# Patient Record
Sex: Female | Born: 1970 | Race: White | Hispanic: No | Marital: Married | Smoking: Never smoker
Health system: Southern US, Community
[De-identification: ages and names within clinical notes are randomized; demographics above are authoritative.]

## PROBLEM LIST (undated history)

## (undated) DIAGNOSIS — E119 Type 2 diabetes mellitus without complications: Secondary | ICD-10-CM

## (undated) DIAGNOSIS — I10 Essential (primary) hypertension: Secondary | ICD-10-CM

## (undated) DIAGNOSIS — E785 Hyperlipidemia, unspecified: Secondary | ICD-10-CM

## (undated) DIAGNOSIS — E210 Primary hyperparathyroidism: Secondary | ICD-10-CM

---

## 1991-12-19 HISTORY — PX: WISDOM TOOTH EXTRACTION: SHX21

## 1999-07-14 ENCOUNTER — Other Ambulatory Visit: Admission: RE | Admit: 1999-07-14 | Discharge: 1999-07-14 | Payer: Self-pay | Admitting: Obstetrics and Gynecology

## 1999-08-26 ENCOUNTER — Encounter: Admission: RE | Admit: 1999-08-26 | Discharge: 1999-11-24 | Payer: Self-pay | Admitting: Internal Medicine

## 1999-09-28 ENCOUNTER — Encounter: Admission: RE | Admit: 1999-09-28 | Discharge: 1999-09-28 | Payer: Self-pay | Admitting: Internal Medicine

## 2000-08-01 ENCOUNTER — Other Ambulatory Visit: Admission: RE | Admit: 2000-08-01 | Discharge: 2000-08-01 | Payer: Self-pay | Admitting: Obstetrics and Gynecology

## 2001-10-09 ENCOUNTER — Other Ambulatory Visit: Admission: RE | Admit: 2001-10-09 | Discharge: 2001-10-09 | Payer: Self-pay | Admitting: Obstetrics & Gynecology

## 2003-01-28 ENCOUNTER — Other Ambulatory Visit: Admission: RE | Admit: 2003-01-28 | Discharge: 2003-01-28 | Payer: Self-pay | Admitting: Gynecology

## 2003-09-16 ENCOUNTER — Ambulatory Visit (HOSPITAL_COMMUNITY): Admission: RE | Admit: 2003-09-16 | Discharge: 2003-09-16 | Payer: Self-pay | Admitting: Obstetrics and Gynecology

## 2003-09-16 ENCOUNTER — Encounter: Payer: Self-pay | Admitting: Obstetrics and Gynecology

## 2003-10-08 ENCOUNTER — Ambulatory Visit (HOSPITAL_COMMUNITY): Admission: RE | Admit: 2003-10-08 | Discharge: 2003-10-08 | Payer: Self-pay | Admitting: Obstetrics and Gynecology

## 2003-10-08 ENCOUNTER — Encounter: Payer: Self-pay | Admitting: Obstetrics and Gynecology

## 2003-10-29 ENCOUNTER — Ambulatory Visit (HOSPITAL_COMMUNITY): Admission: RE | Admit: 2003-10-29 | Discharge: 2003-10-29 | Payer: Self-pay | Admitting: Obstetrics and Gynecology

## 2003-11-06 ENCOUNTER — Encounter: Admission: RE | Admit: 2003-11-06 | Discharge: 2003-11-06 | Payer: Self-pay | Admitting: Obstetrics and Gynecology

## 2003-11-10 ENCOUNTER — Encounter: Admission: RE | Admit: 2003-11-10 | Discharge: 2003-11-10 | Payer: Self-pay | Admitting: Obstetrics and Gynecology

## 2003-11-17 ENCOUNTER — Encounter: Admission: RE | Admit: 2003-11-17 | Discharge: 2003-11-17 | Payer: Self-pay | Admitting: Obstetrics and Gynecology

## 2003-11-19 ENCOUNTER — Ambulatory Visit (HOSPITAL_COMMUNITY): Admission: RE | Admit: 2003-11-19 | Discharge: 2003-11-19 | Payer: Self-pay | Admitting: Obstetrics and Gynecology

## 2003-11-20 ENCOUNTER — Encounter: Admission: RE | Admit: 2003-11-20 | Discharge: 2003-11-20 | Payer: Self-pay | Admitting: Obstetrics and Gynecology

## 2003-11-24 ENCOUNTER — Encounter: Admission: RE | Admit: 2003-11-24 | Discharge: 2003-11-24 | Payer: Self-pay | Admitting: Obstetrics and Gynecology

## 2003-11-27 ENCOUNTER — Encounter: Admission: RE | Admit: 2003-11-27 | Discharge: 2003-11-27 | Payer: Self-pay | Admitting: Obstetrics and Gynecology

## 2003-12-01 ENCOUNTER — Encounter: Admission: RE | Admit: 2003-12-01 | Discharge: 2003-12-01 | Payer: Self-pay | Admitting: Obstetrics and Gynecology

## 2003-12-04 ENCOUNTER — Encounter: Admission: RE | Admit: 2003-12-04 | Discharge: 2003-12-04 | Payer: Self-pay | Admitting: Obstetrics and Gynecology

## 2003-12-07 ENCOUNTER — Encounter: Admission: RE | Admit: 2003-12-07 | Discharge: 2003-12-07 | Payer: Self-pay | Admitting: Obstetrics and Gynecology

## 2003-12-14 ENCOUNTER — Encounter: Admission: RE | Admit: 2003-12-14 | Discharge: 2003-12-14 | Payer: Self-pay | Admitting: Obstetrics and Gynecology

## 2003-12-28 ENCOUNTER — Inpatient Hospital Stay (HOSPITAL_COMMUNITY): Admission: RE | Admit: 2003-12-28 | Discharge: 2003-12-30 | Payer: Self-pay | Admitting: Obstetrics and Gynecology

## 2003-12-28 ENCOUNTER — Encounter (INDEPENDENT_AMBULATORY_CARE_PROVIDER_SITE_OTHER): Payer: Self-pay | Admitting: Specialist

## 2005-03-22 ENCOUNTER — Other Ambulatory Visit: Admission: RE | Admit: 2005-03-22 | Discharge: 2005-03-22 | Payer: Self-pay | Admitting: Obstetrics and Gynecology

## 2006-07-18 ENCOUNTER — Other Ambulatory Visit: Admission: RE | Admit: 2006-07-18 | Discharge: 2006-07-18 | Payer: Self-pay | Admitting: Obstetrics and Gynecology

## 2007-09-24 ENCOUNTER — Ambulatory Visit (HOSPITAL_COMMUNITY): Admission: RE | Admit: 2007-09-24 | Discharge: 2007-09-24 | Payer: Self-pay | Admitting: Gynecology

## 2007-12-19 HISTORY — PX: DILATION AND CURETTAGE OF UTERUS: SHX78

## 2008-01-01 ENCOUNTER — Ambulatory Visit (HOSPITAL_COMMUNITY): Admission: RE | Admit: 2008-01-01 | Discharge: 2008-01-01 | Payer: Self-pay | Admitting: Obstetrics and Gynecology

## 2008-01-01 ENCOUNTER — Encounter (INDEPENDENT_AMBULATORY_CARE_PROVIDER_SITE_OTHER): Payer: Self-pay | Admitting: Obstetrics and Gynecology

## 2008-03-05 ENCOUNTER — Other Ambulatory Visit: Admission: RE | Admit: 2008-03-05 | Discharge: 2008-03-05 | Payer: Self-pay | Admitting: Obstetrics and Gynecology

## 2008-06-15 ENCOUNTER — Ambulatory Visit (HOSPITAL_COMMUNITY): Admission: RE | Admit: 2008-06-15 | Discharge: 2008-06-15 | Payer: Self-pay | Admitting: Obstetrics and Gynecology

## 2008-12-28 ENCOUNTER — Inpatient Hospital Stay (HOSPITAL_COMMUNITY): Admission: AD | Admit: 2008-12-28 | Discharge: 2008-12-31 | Payer: Self-pay | Admitting: Obstetrics and Gynecology

## 2008-12-28 ENCOUNTER — Encounter (INDEPENDENT_AMBULATORY_CARE_PROVIDER_SITE_OTHER): Payer: Self-pay | Admitting: Obstetrics and Gynecology

## 2009-10-13 ENCOUNTER — Other Ambulatory Visit: Admission: RE | Admit: 2009-10-13 | Discharge: 2009-10-13 | Payer: Self-pay | Admitting: Obstetrics and Gynecology

## 2011-04-03 LAB — GLUCOSE, CAPILLARY
Glucose-Capillary: 100 mg/dL — ABNORMAL HIGH (ref 70–99)
Glucose-Capillary: 121 mg/dL — ABNORMAL HIGH (ref 70–99)
Glucose-Capillary: 156 mg/dL — ABNORMAL HIGH (ref 70–99)
Glucose-Capillary: 166 mg/dL — ABNORMAL HIGH (ref 70–99)
Glucose-Capillary: 241 mg/dL — ABNORMAL HIGH (ref 70–99)
Glucose-Capillary: 96 mg/dL (ref 70–99)

## 2011-04-03 LAB — CBC
HCT: 33.1 % — ABNORMAL LOW (ref 36.0–46.0)
MCHC: 32.9 g/dL (ref 30.0–36.0)
MCV: 75 fL — ABNORMAL LOW (ref 78.0–100.0)
RBC: 4.42 MIL/uL (ref 3.87–5.11)
RDW: 18.9 % — ABNORMAL HIGH (ref 11.5–15.5)
WBC: 7.2 10*3/uL (ref 4.0–10.5)

## 2011-04-03 LAB — COMPREHENSIVE METABOLIC PANEL
ALT: 14 U/L (ref 0–35)
BUN: 6 mg/dL (ref 6–23)
CO2: 22 mEq/L (ref 19–32)
Calcium: 7.8 mg/dL — ABNORMAL LOW (ref 8.4–10.5)
Chloride: 103 mEq/L (ref 96–112)
Creatinine, Ser: 0.54 mg/dL (ref 0.4–1.2)
GFR calc Af Amer: >60 mL/min (ref >60)
GFR calc non Af Amer: >60 mL/min (ref >60)
Glucose, Bld: 153 mg/dL — ABNORMAL HIGH (ref 70–99)
Sodium: 131 mEq/L — ABNORMAL LOW (ref 135–145)
Total Bilirubin: 0.2 mg/dL — ABNORMAL LOW (ref 0.3–1.2)
Total Protein: 4.9 g/dL — ABNORMAL LOW (ref 6.0–8.3)

## 2011-04-03 LAB — URIC ACID: Uric Acid, Serum: 7.2 mg/dL — ABNORMAL HIGH (ref 2.4–7.0)

## 2011-04-03 LAB — MAGNESIUM: Magnesium: 4.1 mg/dL — ABNORMAL HIGH (ref 1.5–2.5)

## 2011-04-03 LAB — RPR: RPR Ser Ql: NONREACTIVE

## 2011-05-02 NOTE — Op Note (Signed)
[name], [name]                ACCOUNT NO.:  [id number]   MEDICAL RECORD NO.:  [id number]          PATIENT TYPE:  INP   LOCATION:  9199                          [facility]  [facility]   PHYSICIAN:  Charles A. Delcambre, MDDATE OF BIRTH:  16-Oct-1971   DATE OF PROCEDURE:  12/28/2008  DATE OF DISCHARGE:                               OPERATIVE REPORT   PREOPERATIVE DIAGNOSES:  1. Intrauterine pregnancy at 38 weeks and 3 days.  2. Previous cesarean section.  3. Preeclampsia.  4. Diabetes mellitus.  5. Polyhydramnios.  6. Undesired fertility.   POSTOPERATIVE DIAGNOSES:  1. Intrauterine pregnancy at 38 weeks and 3 days.  2. Previous cesarean section.  3. Preeclampsia.  4. Diabetes mellitus.  5. Polyhydramnios.  6. Undesired fertility.  7. Macrosomia.   PROCEDURE:  Repeat low transverse cesarean section converted to a T  incision on the uterus rendering a classical C-section modified P[place]oy  bilateral tubal ligation.   SURGEON:  Charles A. Delcambre, MD   ASSISTANT:  None.   COMPLICATIONS:  Macrosomia with moderate difficulty delivering the  baby's head.  Vacuum assist was done with 2 pump offs with Mityvac  replacement over the occiput with the Alexis removed.  The uterus T'd to  a classical and the lateral edges of the uterine incision were cut with  bandage scissors.  I did use with the vacuum final attempt and head did  deliver without difficulty at that point.  Baby was tight fit and noted  to be macrosomic.   FINDINGS:  Vigorous female at 80, Apgars 8 and 9 (I heard 8 and 8 from  neonatologist).  Cord arterial blood gas 7.15, venous blood gas 7.24.   SPECIMENS:  Placenta, portion of right and left fallopian tubes to  pathology.  Otherwise, normal uterus and normal tubes and ovaries noted.   DESCRIPTION OF PROCEDURE:  The patient was taken to the operating room  and placed in supine position.  An appendiceal incision was made with a  knife, carried down to fascia.   Fascia was incised with a knife and Mayo  scissors.  Rectus sheath was released superiorly and inferiorly.  The  peritoneum was entered with Metzenbaum scissors without damage to  surrounding structures.  Traction was used to extend the incision.  Alexis retractor was to be placed at that time, but omental adhesions to  the pelvis were noted and these were crossclamped, cut, and then free  tied with 2-0 Vicryl.  This released the omentum to be packed out above.  Pack was placed.  Jon Gills was placed.  I did require the pack to get a  clean sweep.  For that reason, the Jon Gills was applied and lap was then  removed.  Omentum did not cut down into the field, so the lap was  replaced.  Vesicouterine peritoneum was incised with the Metzenbaum  scissors and blunt dissection was used to develop the bladder flap.  This was well down in the Closter retractor.  A transverse incision was  done to the amniotomy.  Large amount of clear fluid was noted.  A hand  was inserted.  Occiput was lifted to the uterine incision, but  operator's assistant's pressure and the physician could not yield  delivery.  Alexis retractor was removed.  For this reason as noted  above, vacuum extractor was used, 2 pop offs in the green zone, Mityvac,  and third application directly over the occiput did achieve delivery  after T'ing the uterus and cutting the lateral edges with the bandage  scissors.  Infant was cut free, handed to the neonatologist after being  shown to the parents.  Placenta was manually extracted.  Uterus was then  closed at the T classical portion with two layers of 0 Vicryl running  locking and some interrupted stitches on the uterus serosa of 2-0  Vicryl.  The remainder of the incision was closed in standard fashion  running nonlocking suture and after subfascial hemostasis was excellent,  fascia was then closed with 0 Vicryl running nonlocking suture.  The  subcutaneous tissue was irrigated.   Intraperitoneally, the pelvis had  been irrigated prior to closure of the fascia and uterine incision was  of good hemostasis.  Bladder flap was of good hemostasis.  The  irrigation was carried out.  Sterile skin clips were used to close the  skin.  Sterile dressing was applied.  The patient was transferred out to  recovery, then to go to [facility] for magnesium sulfate seizure prophylaxis.      Charles A. Sydnee Cabal, MD  Electronically Signed     CAD/MEDQ  D:  12/28/2008  T:  12/29/2008  Job:  [id number]

## 2011-05-02 NOTE — H&P (Signed)
[name], [name]                ACCOUNT NO.:  [id number]   MEDICAL RECORD NO.:  [id number]          PATIENT TYPE:  INP   LOCATION:  NA                            [facility]   PHYSICIAN:  Charles A. Delcambre, MDDATE OF BIRTH:  01/05/1971   DATE OF ADMISSION:  DATE OF DISCHARGE:                              HISTORY & PHYSICAL   A 40 year old, para 1-0-1-1, [facility] January 04, 2009, to be admitted on  January 04, 2009, to undergo repeat cesarean section and bilateral tubal  ligation for sterilization.  She gives informed consent, accepts the  risks of infection, bleeding, bowel and bladder damage, blood product  risk including hepatitis and HIV exposure, ureteral damage, failed tubal  ligation approximately 1 in 400, permanence and irreversibility of the  procedure by design.  All questions were answered.  She gives informed  consent and we will proceed.  At time of this dictation, 1+ protein and  blood pressure of 140/92.  She did have preeclampsia at last pregnancy  as well.  Labs are pending at this time.   PAST MEDICAL HISTORY:  1. PCO.  2. Glucose intolerance to overt diabetes at this time, pregestational.   PAST SURGICAL HISTORY:  D and E, primary low transverse cesarean  section.   MEDICATIONS:  Insulin in the morning 32 NPH 8 regular and in the evening  26 NPH 6 regular, prenatal vitamins, and iron.   ALLERGIES:  No known drug allergies.   SOCIAL HISTORY:  No tobacco, ethanol, or drug use.  Married, monogamous  relationship with her husband.  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   FAMILY HISTORY:  Unrelated.   PHYSICAL EXAMINATION:  VITAL SIGNS:  Blood pressure 144/92, weight 191  pounds, respirations 18, pulse 90.  HEART:  Regular rate and rhythm, 2/6 systolic ejection murmur at the  left sternal border.  LUNGS:  Clear bilaterally.  ABDOMEN:  Gravid.  Fundal height 38.  Ultrasound today continues with  borderline polyhydramnios 28 cm total.  She  has been 26-27 up to 28, 29  and back down to 26 in the latter stages of pregnancy as we checked at  the biophysical profile.  Fetal heart rate 154.  Biophysical profile  10/10.  EXTREMITIES:  [place] pitting edema.  Deep tendon reflexes 1+  symmetrical.  No beats of clonus bilaterally.   ASSESSMENT:  The patient to be 39 weeks and 2 days with pregestational  diabetes, history of preeclampsia, polyhydramnios, mild anemia, desiring  tubal ligation, Rh negative.   PLAN:  Repeat cesarean section, bilateral tubal ligation, diabetes  management sliding scale.  Last hemoglobin checked 9.2 on November 09, 2008.  Since that time, she had a hemoglobin that was 10.3 on December 21, 2008.  Sugars have been in the range of 90-100.  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  She will remain n.p.o. past midnight, and  we will see  her at that time for delivery, otherwise if she were to develop symptoms  or signs of preeclampsia, would move towards delivery time sooner.  Blood test A negative, antibody screen negative, VDRL nonreactive,  rubella immune, hepatitis B surface antigen negative, HIV negative, Pap  negative, GC and chlamydia negative, thyroid and TSH negative, quad  screen negative, first trimester screen normal.  Quad screen only AFP  was done and was negative.  Hemoglobin is noted above.  HIV at 36 weeks  and Group B strep were both negative.       Charles A. Sydnee Cabal, MD  Electronically Signed     CAD/MEDQ  D:  12/24/2008  T:  12/25/2008  Job:  [id number]

## 2011-05-02 NOTE — Op Note (Signed)
NAMEEUFELIA, VENO                ACCOUNT NO.:  [id number]   MEDICAL RECORD NO.:  [id number]          PATIENT TYPE:  AMB   LOCATION:  SDC                           [facility]   PHYSICIAN:  Charles A. Delcambre, MDDATE OF BIRTH:  [birth year]   DATE OF PROCEDURE:  01/01/2008  DATE OF DISCHARGE:                               OPERATIVE REPORT   PREOPERATIVE DIAGNOSIS:  Missed abortion, 7-8 weeks.   POSTOPERATIVE DIAGNOSIS:  Missed abortion, 7-8 weeks.   PROCEDURES:  1. Dilation evacuation.  2. Paracervical block.   SURGEON:  Charles A. Delcambre, MD.   ASSISTANT:  None.   COMPLICATIONS:  None.   ESTIMATED BLOOD LOSS:  Less than 25 mL.   FINDINGS:  Minor amount of products of conception consistent with 8-week  fetal demise.   SPECIMEN:  Products of conception to pathology.   Instrument, sponge, needle count correct x2.   PROCEDURE:  The patient was taken to the operating room, placed in the  supine position and sedation was accomplished.  She was then placed in  dorsal lithotomy position in universal stirrups.  Sterile prep and drape  was undertaken.  A weighted speculum placed in the vagina.  Tenaculum  used on the anterior lip of the cervix.  Paracervical block with 25%  plain Marcaine was placed, 20 mL divided equally between 8:00 and 4:00  o'clock.  There was no evidence of intravascular injection.  Hanks  dilators were used to dilate enough to pass an 8 mm curved suction  curette.  Suction curette was used at 50 cmHg suction with several  passes, followed by a generalized curettage and then several more passes  to get tissue that was mildly adherent.  There was no evidence of  perforation.  Final pass yielded no further tissue.  One final pass  beyond that was done and yielded no further tissue.  The  procedure was terminated.  Tenaculum was removed.  Pressure was held.  Hemostasis was obtained at the tenaculum site.  There was no excessive  bleeding from the  uterus.  Procedure was terminated and the patient was  awakened and taken to recovery with physician in attendance, having  tolerated her procedure well.      Charles A. Sydnee Cabal, MD  Electronically Signed     CAD/MEDQ  D:  01/01/2008  T:  01/01/2008  Job:  [id number]

## 2011-05-02 NOTE — H&P (Signed)
[name], [name]                ACCOUNT NO.:  [id number]   MEDICAL RECORD NO.:  [id number]          PATIENT TYPE:  AMB   LOCATION:  SDC                           [facility]   PHYSICIAN:  Charles A. Delcambre, MDDATE OF BIRTH:  12/09/1971   DATE OF ADMISSION:  DATE OF DISCHARGE:                              HISTORY & PHYSICAL   HISTORY OF PRESENT ILLNESS:  She is a 40 year old para 1-0-0-1, who is  now 7-[redacted] weeks pregnant with fetal demise/missed abortion and is to be  admitted to undergo dilation and evacuation.  She is informed, consents  and accepts risks of bleeding, blood product risks, hepatitis and HIV  exposure, uterine perforation, retained tissue and second D&C.  She was  offered Cytotec versus expectant management, and chooses D&E.   PAST MEDICAL HISTORY:  Diabetes.  Hemoglobin A1c was 5.9 about two  months ago.   PAST SURGICAL HISTORY:  Low-transverse cesarean section.   MEDICATIONS:  Glucophage during the day 1000 mg divided and at bedtime  she takes 30 units of long-acting 24-hour acting insulin.   ALLERGIES:  No known drug allergies.   SOCIAL HISTORY:  No tobacco, ethanol or drug use.  She is married.   FAMILY HISTORY:  Father with renal cell carcinoma and hypertension.   REVIEW OF SYSTEMS:  Denies bleeding, fever, chills, nausea, vomiting,  chest pain, shortness of breath or wheezing, urgency or frequency,  diarrhea or constipation.   PHYSICAL EXAMINATION:  GENERAL:  Alert and oriented x3.  VITAL SIGNS:  Blood pressure 130/70.  Weight 146.  Respirations 20.  Pulse 88.  HEENT:  Exam grossly within normal limits.  NECK:  Supple without thyromegaly or adenopathy.  LUNGS:  Clear bilaterally.  HEART:  Regular rate and rhythm.  ABDOMEN:  Soft, flat and nontender.  PELVIC:  Uterus not enlarged.  Adnexa nontender.  Ovaries are palpated  and normal in size.   ASSESSMENT:  Missed abortion, seven to eight weeks.   PLAN:  Dilation and evacuation.  She is Rh  negative and therefore will  need RhoGAM with the procedure.  All questions are answered.  She is  scheduled for 9:45 tomorrow morning.  She will go ahead and drop her  evening insulin to 10 and be n.p.o. after midnight and will not take her  Glucophage in the morning as well.      Charles A. Sydnee Cabal, MD  Electronically Signed     CAD/MEDQ  D:  12/31/2007  T:  12/31/2007  Job:  [id number]

## 2011-05-05 NOTE — H&P (Signed)
[name], [name]                            ACCOUNT NO.:  [id number]   MEDICAL RECORD NO.:  [id number]                   PATIENT TYPE:   LOCATION:                                       [facility]   PHYSICIAN:  Charles A. Sydnee Cabal, MD            DATE OF BIRTH:  05/07/1971   DATE OF ADMISSION:  12/28/2003  DATE OF DISCHARGE:                                HISTORY & PHYSICAL   REASON FOR ADMISSION:  The patient to be admitted December 28, 2003 to  undergo primary low transverse cesarean section secondary to unfavorable  cervix, macrosomia, pregnancy-induced hypertension, and insulin-dependent  diabetes mellitus.   A 40 year old para 0-0-0-0 with [facility] January 02, 2004.  The patient will be  pregnant [redacted] weeks 2 days on day of admission.  She gives informed consent  after counseling regarding options of induction versus cesarean section.  She opts for cesarean section.  She accepts risks of infection, bleeding,  bowel and bladder damage, ureteral damage, blood product risks including  hepatitis and HIV exposure.  All questions were answered.  She last had  blood pressure 145/92 on December 24, 2003, declining intervention at this  time.  Stable pattern of sugars on insulin dosage not specified with doses  managed with Dr. Talmage Nap.  PIH labs were normal today with creatinine 0.6,  uric acid 6.5, AST 25, platelets stable at 195.  A 24-hour urine protein was  585 mg dated December 20, 2003.   PAST MEDICAL HISTORY:  1. PCO.  2. Glucose intolerance.   PAST SURGICAL HISTORY:  None.   MEDICATIONS:  Insulin 70/30 split, doses not specified.   ALLERGIES:  No known drug allergies.   SOCIAL HISTORY:  No tobacco, ethanol, or drug use.   FAMILY HISTORY:  Unrelated.   REVIEW OF SYSTEMS:  She denies headache, scotomata, right upper quadrant  pain, or blurred vision.   PHYSICAL EXAMINATION:  GENERAL:  Alert and oriented x3, no distress.  VITAL SIGNS:  Blood pressure 145/92, respirations 16,  pulse 80, weight 182  pounds.  HEENT:  Normal.  NECK:  Supple without thyromegaly or adenopathy.  LUNGS:  Clear bilaterally.  HEART:  Regular rate and rhythm without murmur, rub, or gallop.  BREAST:  Deferred.  ABDOMEN:  Gravid, fundal height 39 cm.  PELVIC:  Cervix is closed and posterior.  EXTREMITIES:  Mild to moderate edema.  Deep tendon reflexes 2+.   LABORATORY DATA:  As noted above.   ASSESSMENT:  1. Intrauterine pregnancy at 39 weeks 2 days on day of admission with     insulin-dependent diabetes mellitus, pregnancy-induced hypertension, mild     but with significant proteinuria.  2. Estimated fetal weight on ultrasound today, day of dictation - December 24, 2003 - was 4004 g, likely to be over 4000 g next week; 8 pounds 13     ounces, likely to be beyond  9 pounds next week.   For these combined reasons the patient opts for cesarean section and I  concur.  We will plan n.p.o. past midnight the morning of admission, no  insulin, repeat PIH labs the morning of admission, bedrest with PIH  precautions are ordered between now and the time of admission.  All  questions are answered.  She gives informed consent and we will proceed as  outlined.                                               Charles A. Sydnee Cabal, MD    CAD/MEDQ  D:  12/24/2003  T:  12/24/2003  Job:  [id number]

## 2011-05-05 NOTE — Discharge Summary (Signed)
NAME:  [name], [name]                          ACCOUNT NO.:  [id number]   MEDICAL RECORD NO.:  [id number]                   PATIENT TYPE:  INP   LOCATION:  9104                                 [facility]   PHYSICIAN:  Charles A. Sydnee Cabal, MD            DATE OF BIRTH:  September 10, 1971   DATE OF ADMISSION:  12/28/2003  DATE OF DISCHARGE:  12/30/2003                                 DISCHARGE SUMMARY   DISCHARGE DIAGNOSES:  1. Intrauterine pregnancy at 39 weeks, 2 days.  2. Gestational diabetes, Insulin requiring.  3. Preeclampsia.  4. Macrosomia.   PROCEDURE:  Primary low transverse cesarean section.   DISPOSITION:  The patient was discharged home to follow up in the office in  48 hours for staples to be discontinued.  She was given convalescent  instructions.  Notify if temperature is greater than 101, increased bleeding  or pain or purulent discharge from the incision.  She is cautioned from  driving as well as heavy lifting greater than 25 pounds.   DISCHARGE MEDICATION:  Percocet 1 to 2 p.o. q.4h p.r.n.   History and physical as dictated and on the chart.   LABORATORY DATA:  Postoperative hemoglobin 10.1, hematocrit 28.6.  Chemistry  profile significant for elevated alkaline phosphatase at 161.   HOSPITAL COURSE:  The patient was admitted and underwent surgery as noted  above.  Postoperatively the patient had sugars of 129 and 110.  She desired  to not have any further sugars checked.  She was placed on magnesium  prophylaxis postpartum for 24 hours.  She was monitored in the AICU  secondary to the magnesium prophylaxis.  On postoperative day number 1 she  was moved from the unit and magnesium was discontinued at 12 o'clock.  Glucoses were continued with consent and noted to be 104 and 122, there was  one less than 2 hours after a snack that was 150, and fasting was again 112.  The patient strongly desired to discharged home.  She was voiding without  difficulty after  catheter was discontinued on postoperative day number 1.  She was tolerating a general diet with spontaneous return of bowel function  and pain was well controlled on p.o. medications.  She was discharged home  with follow up as noted above.                                               Charles A. Sydnee Cabal, MD    CAD/MEDQ  D:  01/27/2004  T:  01/27/2004  Job:  [id number]

## 2011-05-05 NOTE — Op Note (Signed)
NAME:  [name], [name]                          ACCOUNT NO.:  [id number]   MEDICAL RECORD NO.:  [id number]                   PATIENT TYPE:  INP   LOCATION:  9114                                 [facility]   PHYSICIAN:  Charles A. Sydnee Cabal, MD            DATE OF BIRTH:  1971-03-16   DATE OF PROCEDURE:  12/28/2003  DATE OF DISCHARGE:                                 OPERATIVE REPORT   PREOPERATIVE DIAGNOSES:  1. Intrauterine pregnancy at 39 weeks and two days.  2. Gestational diabetes, insulin dependent.  3. Preeclampsia.  4. Macrosomia.   POSTOPERATIVE DIAGNOSES:  1. Intrauterine pregnancy at 39 weeks and two days.  2. Gestational diabetes, insulin dependent.  3. Preeclampsia.  4. Macrosomia.   PROCEDURE:  Primary low transverse cesarean section, elective.   SURGEON:  Charles A. Sydnee Cabal, MD   ASSISTANT:  Rudy Jew. Ashley Royalty, M.D.   COMPLICATIONS:  Nuchal cord x1.   ESTIMATED BLOOD LOSS:  500 mL.   ANESTHESIA:  Spinal anesthesia.   SPECIMENS:  Placenta to pathology.   FINDINGS:  Clear amniotic fluid, vigorous female, Apgars 8 and 9.  Sponge,  needle and instrument counts were correct x2. Neonatologist in attendance  with delivery.  Baby to the nursery.  Mother stable to recovery.   DESCRIPTION OF PROCEDURE:  The patient was taken to the operating room and  placed in the supine position, spinal anesthetic induced without difficulty.  Sterile prep and drape was done.  A Pfannenstiel incision was made with the  knife and carried down to the fascia.  The fascia was incised with the knife  and Mayo scissors.  Rectus sheath was sharply dissected superiorly and  inferiorly.  Rectus muscles were sharply dissected in the midline.  Peritoneum was entered with Metzenbaum scissors.  Traction was used to  extend the incision.  Bladder blade was placed.  Vesicouterine peritoneum  was incised with Metzenbaum scissors.  Blunt dissection was used to develop  the bladder flap.   Bladder blade was replaced.  Lower uterine segment  transverse incision was then made with the knife.  There was no damage to  the infant.  Traction was used to extend the incision.  Hand was inserted.  Fundal pressure was applied with operator's assistant and the infant was  delivered without difficulty.  Cord was clamped and infant was cut free,  shown to the parents and taken to the neonatologist.  Placenta was manually  expressed.  Internal surface of the uterus was wiped with moistened lap.  Uterus was then closed in two layers, first layer #1 chromic running and  locking.  Second layer imbricating, nonlocking #1 chromic.  Hemostasis was  excellent with single figure-of-eight suture placed near the left angle.  Pericolic gutters were cleansed of clotted blood and material.  Ovaries were  visualized bilaterally.  Hemostasis was again verified at the uterine  incision.  Subfascial hemostasis was good.  Fascia  was closed with #1 Vicryl  running nonlocking suture.  Subcutaneous hemostasis was noted to be good and  irrigation was carried out.  Sterile skin clips were used to close the skin.  Sterile dressing was applied.  The patient was taken to the recovery room  with physician in attendance having tolerated the procedure well.  Magnesium  sulfate will be started, 4 g bolus, 2 g per hour, in recovery and will  continue this for 24 hours secondary to preeclampsia.                                               Charles A. Sydnee Cabal, MD    CAD/MEDQ  D:  12/28/2003  T:  12/28/2003  Job:  [id number]

## 2011-05-05 NOTE — Discharge Summary (Signed)
[name], [name]                ACCOUNT NO.:  [id number]   MEDICAL RECORD NO.:  [id number]          PATIENT TYPE:  INP   LOCATION:  9108                          FACILITY:  [facility]   PHYSICIAN:  Charles A. Delcambre, MDDATE OF BIRTH:  12/28/1970   DATE OF ADMISSION:  12/28/2008  DATE OF DISCHARGE:  12/31/2008                               DISCHARGE SUMMARY   PRIMARY DISCHARGE DIAGNOSES:  1. Intrauterine pregnancy 38 weeks 3 days.  2. Preeclampsia.  3. Diabetes mellitus.  4. Polyhydramnios.  5. Undesired fertility.   PROCEDURE:  Repeat low transverse cesarean section, teed to a classical  cesarean section to get the baby out.  Modified [place] bilateral tubal  ligation.   FINDINGS:  Vigorous female, Apgars 8 and 9.  Cord arterial blood gas  7.15 and venous blood gas 7.24.   SPECIMENS:  Placenta in portion of right and left fallopian tubes to  pathology.   DISPOSITION:  The patient is discharged home to follow up in the office  in 24 hours time.  Staples discontinued.  I checked blood pressure and  CBG.  She is given precaution for headache, scotoma, and right upper  quadrant pain as well as no lifting for rhythm 25 pounds for 1 month to  notify incisional redness or drainage.  Temperature over 100 degrees.  No driving for 2 weeks.  Shower okay for 2 weeks and bath thereafter.   MEDICATION ON DISCHARGE:  1. Percocet 15/325 1-2 p.o. q.4 h p.r.n.  2. Motrin 800 mg 1 p.o. q.8 h p.r.n.  3. Over-the-counter iron 1 tablet a day.  4. 10 units of NovoLog 70/30 evening of delivery and then checked CBC      tomorrow.  The patient well-versed in managing diet on insulin on a      subcu sliding scale.   HISTORY AND PHYSICAL:  As dictated on the chart.   HOSPITAL COURSE:  The patient was admitted and underwent surgery as  noted above.  Vacuum-assist was done, difficult to get the baby out,  therefore uterus was teed.  She went to AICU to undergo MAC therapy.  She denied PIH symptoms at  that time.  Pain was controlled.  Diet was  tolerated.  Blood pressures were as high as 169/100.  She diuresed well  in ICU, had a few crackles in her lung bases that resolved.  She was  watched overnight with sliding-scale insulin and possible OC.  Postop  day #2, she was moved out of the unit and blood pressures were 130-  150/70.  Blood sugars 150, 207.  She was given 2 units of insulin h.s. with 207.  Postop 3, she was doing well.  Sugars were much better except evening  sugars.  She was precautioned and instructed and released home on postop  day #3.  Significant laboratory, postoperative hemoglobin 9.2,  hematocrit 27.9.      Charles A. Sydnee Cabal, MD  Electronically Signed     CAD/MEDQ  D:  02/06/2009  T:  02/07/2009  Job:  [id number]

## 2011-09-06 LAB — RH IMMUNE GLOBULIN WORKUP (NOT WOMEN'S HOSP)
ABO/RH(D): A NEG
Antibody Screen: NEGATIVE

## 2011-09-06 LAB — CBC
Hemoglobin: 11.9 — ABNORMAL LOW
Platelets: 374
RDW: 15.4

## 2011-10-12 ENCOUNTER — Other Ambulatory Visit (HOSPITAL_COMMUNITY)
Admission: RE | Admit: 2011-10-12 | Discharge: 2011-10-12 | Disposition: A | Discharge status: Home / Self Care | Payer: PRIVATE HEALTH INSURANCE | Source: Ambulatory Visit | Attending: Obstetrics and Gynecology | Admitting: Obstetrics and Gynecology

## 2011-10-12 ENCOUNTER — Other Ambulatory Visit: Payer: Self-pay | Admitting: Nurse Practitioner

## 2011-10-12 DIAGNOSIS — Z01419 Encounter for gynecological examination (general) (routine) without abnormal findings: Secondary | ICD-10-CM | POA: Insufficient documentation

## 2011-10-12 DIAGNOSIS — Z1159 Encounter for screening for other viral diseases: Secondary | ICD-10-CM | POA: Insufficient documentation

## 2014-01-20 ENCOUNTER — Other Ambulatory Visit: Payer: Self-pay | Admitting: Family Medicine

## 2014-01-20 DIAGNOSIS — R748 Abnormal levels of other serum enzymes: Secondary | ICD-10-CM

## 2014-02-11 ENCOUNTER — Other Ambulatory Visit (HOSPITAL_COMMUNITY): Payer: PRIVATE HEALTH INSURANCE

## 2014-02-19 ENCOUNTER — Other Ambulatory Visit (HOSPITAL_COMMUNITY): Payer: PRIVATE HEALTH INSURANCE

## 2014-02-20 ENCOUNTER — Encounter (HOSPITAL_COMMUNITY): Payer: Self-pay | Admitting: Internal Medicine

## 2014-09-03 ENCOUNTER — Other Ambulatory Visit (HOSPITAL_COMMUNITY)
Admission: RE | Admit: 2014-09-03 | Discharge: 2014-09-03 | Disposition: A | Discharge status: Home / Self Care | Payer: BC Managed Care – PPO | Source: Ambulatory Visit | Attending: Obstetrics and Gynecology | Admitting: Obstetrics and Gynecology

## 2014-09-03 ENCOUNTER — Other Ambulatory Visit: Payer: Self-pay | Admitting: Nurse Practitioner

## 2014-09-03 DIAGNOSIS — Z01419 Encounter for gynecological examination (general) (routine) without abnormal findings: Secondary | ICD-10-CM | POA: Diagnosis present

## 2014-09-07 LAB — CYTOLOGY - PAP

## 2015-03-24 ENCOUNTER — Other Ambulatory Visit: Payer: Self-pay

## 2015-03-24 DIAGNOSIS — Z1231 Encounter for screening mammogram for malignant neoplasm of breast: Secondary | ICD-10-CM

## 2015-04-08 ENCOUNTER — Ambulatory Visit
Admission: RE | Admit: 2015-04-08 | Discharge: 2015-04-08 | Disposition: A | Discharge status: Home / Self Care | Payer: BLUE CROSS/BLUE SHIELD | Source: Ambulatory Visit

## 2015-04-08 DIAGNOSIS — Z1231 Encounter for screening mammogram for malignant neoplasm of breast: Secondary | ICD-10-CM

## 2016-05-17 DIAGNOSIS — F411 Generalized anxiety disorder: Secondary | ICD-10-CM | POA: Diagnosis not present

## 2016-05-17 DIAGNOSIS — Z23 Encounter for immunization: Secondary | ICD-10-CM | POA: Diagnosis not present

## 2016-05-17 DIAGNOSIS — Z862 Personal history of diseases of the blood and blood-forming organs and certain disorders involving the immune mechanism: Secondary | ICD-10-CM | POA: Diagnosis not present

## 2016-05-17 DIAGNOSIS — Z79899 Other long term (current) drug therapy: Secondary | ICD-10-CM | POA: Diagnosis not present

## 2016-05-17 DIAGNOSIS — E119 Type 2 diabetes mellitus without complications: Secondary | ICD-10-CM | POA: Diagnosis not present

## 2016-07-11 DIAGNOSIS — D2262 Melanocytic nevi of left upper limb, including shoulder: Secondary | ICD-10-CM | POA: Diagnosis not present

## 2016-07-11 DIAGNOSIS — D225 Melanocytic nevi of trunk: Secondary | ICD-10-CM | POA: Diagnosis not present

## 2016-07-11 DIAGNOSIS — L814 Other melanin hyperpigmentation: Secondary | ICD-10-CM | POA: Diagnosis not present

## 2016-07-11 DIAGNOSIS — D2261 Melanocytic nevi of right upper limb, including shoulder: Secondary | ICD-10-CM | POA: Diagnosis not present

## 2016-07-13 DIAGNOSIS — N181 Chronic kidney disease, stage 1: Secondary | ICD-10-CM | POA: Diagnosis not present

## 2016-07-13 DIAGNOSIS — E785 Hyperlipidemia, unspecified: Secondary | ICD-10-CM | POA: Diagnosis not present

## 2016-07-13 DIAGNOSIS — E1122 Type 2 diabetes mellitus with diabetic chronic kidney disease: Secondary | ICD-10-CM | POA: Diagnosis not present

## 2016-07-13 DIAGNOSIS — E282 Polycystic ovarian syndrome: Secondary | ICD-10-CM | POA: Diagnosis not present

## 2016-10-10 DIAGNOSIS — Z23 Encounter for immunization: Secondary | ICD-10-CM | POA: Diagnosis not present

## 2016-12-18 DIAGNOSIS — D649 Anemia, unspecified: Secondary | ICD-10-CM

## 2016-12-18 HISTORY — DX: Anemia, unspecified: D64.9

## 2017-06-04 DIAGNOSIS — F4323 Adjustment disorder with mixed anxiety and depressed mood: Secondary | ICD-10-CM | POA: Diagnosis not present

## 2017-07-11 DIAGNOSIS — R350 Frequency of micturition: Secondary | ICD-10-CM | POA: Diagnosis not present

## 2017-07-11 DIAGNOSIS — E1122 Type 2 diabetes mellitus with diabetic chronic kidney disease: Secondary | ICD-10-CM | POA: Diagnosis not present

## 2017-07-11 DIAGNOSIS — F411 Generalized anxiety disorder: Secondary | ICD-10-CM | POA: Diagnosis not present

## 2017-07-11 DIAGNOSIS — D509 Iron deficiency anemia, unspecified: Secondary | ICD-10-CM | POA: Diagnosis not present

## 2017-08-02 DIAGNOSIS — E282 Polycystic ovarian syndrome: Secondary | ICD-10-CM | POA: Diagnosis not present

## 2017-08-02 DIAGNOSIS — N181 Chronic kidney disease, stage 1: Secondary | ICD-10-CM | POA: Diagnosis not present

## 2017-08-02 DIAGNOSIS — E785 Hyperlipidemia, unspecified: Secondary | ICD-10-CM | POA: Diagnosis not present

## 2017-08-02 DIAGNOSIS — E1122 Type 2 diabetes mellitus with diabetic chronic kidney disease: Secondary | ICD-10-CM | POA: Diagnosis not present

## 2017-08-14 DIAGNOSIS — F4323 Adjustment disorder with mixed anxiety and depressed mood: Secondary | ICD-10-CM | POA: Diagnosis not present

## 2017-08-21 DIAGNOSIS — F4323 Adjustment disorder with mixed anxiety and depressed mood: Secondary | ICD-10-CM | POA: Diagnosis not present

## 2017-08-21 DIAGNOSIS — S29012A Strain of muscle and tendon of back wall of thorax, initial encounter: Secondary | ICD-10-CM | POA: Diagnosis not present

## 2017-09-11 ENCOUNTER — Other Ambulatory Visit: Payer: Self-pay | Admitting: Family Medicine

## 2017-09-11 DIAGNOSIS — Z1231 Encounter for screening mammogram for malignant neoplasm of breast: Secondary | ICD-10-CM

## 2017-09-17 DIAGNOSIS — Z6823 Body mass index (BMI) 23.0-23.9, adult: Secondary | ICD-10-CM | POA: Diagnosis not present

## 2017-09-17 DIAGNOSIS — M545 Low back pain: Secondary | ICD-10-CM | POA: Diagnosis not present

## 2017-09-17 DIAGNOSIS — N390 Urinary tract infection, site not specified: Secondary | ICD-10-CM | POA: Diagnosis not present

## 2017-09-17 DIAGNOSIS — R300 Dysuria: Secondary | ICD-10-CM | POA: Diagnosis not present

## 2017-09-19 DIAGNOSIS — N181 Chronic kidney disease, stage 1: Secondary | ICD-10-CM | POA: Diagnosis not present

## 2017-09-19 DIAGNOSIS — E1122 Type 2 diabetes mellitus with diabetic chronic kidney disease: Secondary | ICD-10-CM | POA: Diagnosis not present

## 2017-09-19 DIAGNOSIS — E282 Polycystic ovarian syndrome: Secondary | ICD-10-CM | POA: Diagnosis not present

## 2017-09-19 DIAGNOSIS — E785 Hyperlipidemia, unspecified: Secondary | ICD-10-CM | POA: Diagnosis not present

## 2017-09-24 DIAGNOSIS — R399 Unspecified symptoms and signs involving the genitourinary system: Secondary | ICD-10-CM | POA: Diagnosis not present

## 2017-09-25 ENCOUNTER — Ambulatory Visit
Admission: RE | Admit: 2017-09-25 | Discharge: 2017-09-25 | Disposition: A | Discharge status: Home / Self Care | Payer: BLUE CROSS/BLUE SHIELD | Source: Ambulatory Visit | Attending: Family Medicine | Admitting: Family Medicine

## 2017-09-25 DIAGNOSIS — Z1231 Encounter for screening mammogram for malignant neoplasm of breast: Secondary | ICD-10-CM

## 2017-09-27 ENCOUNTER — Other Ambulatory Visit: Payer: Self-pay | Admitting: Family Medicine

## 2017-09-27 ENCOUNTER — Ambulatory Visit
Admission: RE | Admit: 2017-09-27 | Discharge: 2017-09-27 | Disposition: A | Discharge status: Home / Self Care | Payer: BLUE CROSS/BLUE SHIELD | Source: Ambulatory Visit | Attending: Family Medicine | Admitting: Family Medicine

## 2017-09-27 DIAGNOSIS — N181 Chronic kidney disease, stage 1: Secondary | ICD-10-CM

## 2017-09-27 DIAGNOSIS — R319 Hematuria, unspecified: Secondary | ICD-10-CM

## 2017-10-01 DIAGNOSIS — R8290 Unspecified abnormal findings in urine: Secondary | ICD-10-CM | POA: Diagnosis not present

## 2017-10-01 DIAGNOSIS — R3989 Other symptoms and signs involving the genitourinary system: Secondary | ICD-10-CM | POA: Diagnosis not present

## 2017-10-02 ENCOUNTER — Other Ambulatory Visit: Payer: Self-pay | Admitting: Obstetrics and Gynecology

## 2017-10-02 ENCOUNTER — Other Ambulatory Visit (HOSPITAL_COMMUNITY)
Admission: RE | Admit: 2017-10-02 | Discharge: 2017-10-02 | Disposition: A | Discharge status: Home / Self Care | Payer: BLUE CROSS/BLUE SHIELD | Source: Ambulatory Visit | Attending: Obstetrics and Gynecology | Admitting: Obstetrics and Gynecology

## 2017-10-02 DIAGNOSIS — N952 Postmenopausal atrophic vaginitis: Secondary | ICD-10-CM | POA: Diagnosis not present

## 2017-10-02 DIAGNOSIS — N939 Abnormal uterine and vaginal bleeding, unspecified: Secondary | ICD-10-CM | POA: Diagnosis not present

## 2017-10-02 DIAGNOSIS — Z124 Encounter for screening for malignant neoplasm of cervix: Secondary | ICD-10-CM | POA: Insufficient documentation

## 2017-10-02 DIAGNOSIS — Z3202 Encounter for pregnancy test, result negative: Secondary | ICD-10-CM | POA: Diagnosis not present

## 2017-10-05 LAB — CYTOLOGY - PAP: HPV (WINDOPATH): NOT DETECTED

## 2017-10-17 ENCOUNTER — Other Ambulatory Visit (HOSPITAL_COMMUNITY)
Admission: RE | Admit: 2017-10-17 | Discharge: 2017-10-17 | Disposition: A | Discharge status: Home / Self Care | Payer: BLUE CROSS/BLUE SHIELD | Source: Ambulatory Visit | Attending: Obstetrics and Gynecology | Admitting: Obstetrics and Gynecology

## 2017-10-17 ENCOUNTER — Other Ambulatory Visit: Payer: Self-pay | Admitting: Obstetrics and Gynecology

## 2017-10-17 DIAGNOSIS — Z124 Encounter for screening for malignant neoplasm of cervix: Secondary | ICD-10-CM | POA: Diagnosis not present

## 2017-10-17 DIAGNOSIS — N939 Abnormal uterine and vaginal bleeding, unspecified: Secondary | ICD-10-CM | POA: Diagnosis not present

## 2017-10-19 LAB — CYTOLOGY - PAP
CHLAMYDIA, DNA PROBE: NEGATIVE
Diagnosis: NEGATIVE
Neisseria Gonorrhea: NEGATIVE

## 2017-10-23 DIAGNOSIS — F4323 Adjustment disorder with mixed anxiety and depressed mood: Secondary | ICD-10-CM | POA: Diagnosis not present

## 2017-11-05 DIAGNOSIS — F4323 Adjustment disorder with mixed anxiety and depressed mood: Secondary | ICD-10-CM | POA: Diagnosis not present

## 2018-01-11 DIAGNOSIS — E282 Polycystic ovarian syndrome: Secondary | ICD-10-CM | POA: Diagnosis not present

## 2018-01-11 DIAGNOSIS — E1122 Type 2 diabetes mellitus with diabetic chronic kidney disease: Secondary | ICD-10-CM | POA: Diagnosis not present

## 2018-01-11 DIAGNOSIS — E881 Lipodystrophy, not elsewhere classified: Secondary | ICD-10-CM | POA: Diagnosis not present

## 2018-01-11 DIAGNOSIS — N181 Chronic kidney disease, stage 1: Secondary | ICD-10-CM | POA: Diagnosis not present

## 2018-01-11 DIAGNOSIS — Z862 Personal history of diseases of the blood and blood-forming organs and certain disorders involving the immune mechanism: Secondary | ICD-10-CM | POA: Diagnosis not present

## 2018-01-11 DIAGNOSIS — E785 Hyperlipidemia, unspecified: Secondary | ICD-10-CM | POA: Diagnosis not present

## 2018-02-06 ENCOUNTER — Other Ambulatory Visit: Payer: Self-pay | Admitting: Obstetrics and Gynecology

## 2018-02-06 DIAGNOSIS — D260 Other benign neoplasm of cervix uteri: Secondary | ICD-10-CM | POA: Diagnosis not present

## 2018-02-06 DIAGNOSIS — N939 Abnormal uterine and vaginal bleeding, unspecified: Secondary | ICD-10-CM | POA: Diagnosis not present

## 2018-02-06 DIAGNOSIS — R9389 Abnormal findings on diagnostic imaging of other specified body structures: Secondary | ICD-10-CM | POA: Diagnosis not present

## 2018-02-06 DIAGNOSIS — N920 Excessive and frequent menstruation with regular cycle: Secondary | ICD-10-CM | POA: Diagnosis not present

## 2018-02-08 DIAGNOSIS — D260 Other benign neoplasm of cervix uteri: Secondary | ICD-10-CM | POA: Diagnosis not present

## 2018-02-08 DIAGNOSIS — Z30430 Encounter for insertion of intrauterine contraceptive device: Secondary | ICD-10-CM | POA: Diagnosis not present

## 2018-09-27 DIAGNOSIS — E881 Lipodystrophy, not elsewhere classified: Secondary | ICD-10-CM | POA: Diagnosis not present

## 2018-09-27 DIAGNOSIS — E1122 Type 2 diabetes mellitus with diabetic chronic kidney disease: Secondary | ICD-10-CM | POA: Diagnosis not present

## 2018-09-27 DIAGNOSIS — E785 Hyperlipidemia, unspecified: Secondary | ICD-10-CM | POA: Diagnosis not present

## 2018-09-27 DIAGNOSIS — Z79899 Other long term (current) drug therapy: Secondary | ICD-10-CM | POA: Diagnosis not present

## 2018-09-27 DIAGNOSIS — N181 Chronic kidney disease, stage 1: Secondary | ICD-10-CM | POA: Diagnosis not present

## 2018-09-27 DIAGNOSIS — E282 Polycystic ovarian syndrome: Secondary | ICD-10-CM | POA: Diagnosis not present

## 2018-09-27 DIAGNOSIS — Z862 Personal history of diseases of the blood and blood-forming organs and certain disorders involving the immune mechanism: Secondary | ICD-10-CM | POA: Diagnosis not present

## 2018-10-21 DIAGNOSIS — N898 Other specified noninflammatory disorders of vagina: Secondary | ICD-10-CM | POA: Diagnosis not present

## 2018-10-21 DIAGNOSIS — Z01411 Encounter for gynecological examination (general) (routine) with abnormal findings: Secondary | ICD-10-CM | POA: Diagnosis not present

## 2019-06-11 ENCOUNTER — Other Ambulatory Visit: Payer: Self-pay | Admitting: *Deleted

## 2019-06-11 DIAGNOSIS — Z20822 Contact with and (suspected) exposure to covid-19: Secondary | ICD-10-CM

## 2019-06-14 LAB — NOVEL CORONAVIRUS, NAA: SARS-CoV-2, NAA: NOT DETECTED

## 2019-06-27 DIAGNOSIS — E785 Hyperlipidemia, unspecified: Secondary | ICD-10-CM | POA: Diagnosis not present

## 2019-06-27 DIAGNOSIS — E881 Lipodystrophy, not elsewhere classified: Secondary | ICD-10-CM | POA: Diagnosis not present

## 2019-06-27 DIAGNOSIS — Z862 Personal history of diseases of the blood and blood-forming organs and certain disorders involving the immune mechanism: Secondary | ICD-10-CM | POA: Diagnosis not present

## 2019-06-27 DIAGNOSIS — E1122 Type 2 diabetes mellitus with diabetic chronic kidney disease: Secondary | ICD-10-CM | POA: Diagnosis not present

## 2019-06-27 DIAGNOSIS — E282 Polycystic ovarian syndrome: Secondary | ICD-10-CM | POA: Diagnosis not present

## 2019-09-05 DIAGNOSIS — L814 Other melanin hyperpigmentation: Secondary | ICD-10-CM | POA: Diagnosis not present

## 2019-09-05 DIAGNOSIS — L820 Inflamed seborrheic keratosis: Secondary | ICD-10-CM | POA: Diagnosis not present

## 2019-09-05 DIAGNOSIS — D2372 Other benign neoplasm of skin of left lower limb, including hip: Secondary | ICD-10-CM | POA: Diagnosis not present

## 2019-10-28 DIAGNOSIS — E785 Hyperlipidemia, unspecified: Secondary | ICD-10-CM | POA: Diagnosis not present

## 2019-10-28 DIAGNOSIS — N898 Other specified noninflammatory disorders of vagina: Secondary | ICD-10-CM | POA: Diagnosis not present

## 2019-10-28 DIAGNOSIS — Z01411 Encounter for gynecological examination (general) (routine) with abnormal findings: Secondary | ICD-10-CM | POA: Diagnosis not present

## 2019-10-28 DIAGNOSIS — R8290 Unspecified abnormal findings in urine: Secondary | ICD-10-CM | POA: Diagnosis not present

## 2019-10-28 DIAGNOSIS — E1122 Type 2 diabetes mellitus with diabetic chronic kidney disease: Secondary | ICD-10-CM | POA: Diagnosis not present

## 2019-12-09 ENCOUNTER — Ambulatory Visit: Payer: BC Managed Care – PPO | Attending: Internal Medicine

## 2019-12-09 DIAGNOSIS — Z20828 Contact with and (suspected) exposure to other viral communicable diseases: Secondary | ICD-10-CM | POA: Diagnosis not present

## 2019-12-09 DIAGNOSIS — Z20822 Contact with and (suspected) exposure to covid-19: Secondary | ICD-10-CM

## 2019-12-11 LAB — NOVEL CORONAVIRUS, NAA: SARS-CoV-2, NAA: NOT DETECTED

## 2020-01-07 DIAGNOSIS — Z20828 Contact with and (suspected) exposure to other viral communicable diseases: Secondary | ICD-10-CM | POA: Diagnosis not present

## 2020-01-07 DIAGNOSIS — Z03818 Encounter for observation for suspected exposure to other biological agents ruled out: Secondary | ICD-10-CM | POA: Diagnosis not present

## 2020-01-21 ENCOUNTER — Other Ambulatory Visit: Payer: Self-pay | Admitting: Family Medicine

## 2020-01-21 DIAGNOSIS — Z1231 Encounter for screening mammogram for malignant neoplasm of breast: Secondary | ICD-10-CM

## 2020-01-23 ENCOUNTER — Ambulatory Visit
Admission: RE | Admit: 2020-01-23 | Discharge: 2020-01-23 | Disposition: A | Discharge status: Home / Self Care | Payer: BLUE CROSS/BLUE SHIELD | Source: Ambulatory Visit

## 2020-01-23 ENCOUNTER — Other Ambulatory Visit: Payer: Self-pay

## 2020-01-23 DIAGNOSIS — E785 Hyperlipidemia, unspecified: Secondary | ICD-10-CM | POA: Diagnosis not present

## 2020-01-23 DIAGNOSIS — E282 Polycystic ovarian syndrome: Secondary | ICD-10-CM | POA: Diagnosis not present

## 2020-01-23 DIAGNOSIS — E1122 Type 2 diabetes mellitus with diabetic chronic kidney disease: Secondary | ICD-10-CM | POA: Diagnosis not present

## 2020-01-23 DIAGNOSIS — E881 Lipodystrophy, not elsewhere classified: Secondary | ICD-10-CM | POA: Diagnosis not present

## 2020-01-23 DIAGNOSIS — Z1231 Encounter for screening mammogram for malignant neoplasm of breast: Secondary | ICD-10-CM | POA: Diagnosis not present

## 2020-04-07 DIAGNOSIS — Z794 Long term (current) use of insulin: Secondary | ICD-10-CM | POA: Diagnosis not present

## 2020-04-07 DIAGNOSIS — H2513 Age-related nuclear cataract, bilateral: Secondary | ICD-10-CM | POA: Diagnosis not present

## 2020-04-07 DIAGNOSIS — E1136 Type 2 diabetes mellitus with diabetic cataract: Secondary | ICD-10-CM | POA: Diagnosis not present

## 2020-05-12 DIAGNOSIS — R197 Diarrhea, unspecified: Secondary | ICD-10-CM | POA: Diagnosis not present

## 2020-05-12 DIAGNOSIS — E1122 Type 2 diabetes mellitus with diabetic chronic kidney disease: Secondary | ICD-10-CM | POA: Diagnosis not present

## 2020-05-12 DIAGNOSIS — Z5181 Encounter for therapeutic drug level monitoring: Secondary | ICD-10-CM | POA: Diagnosis not present

## 2020-05-12 DIAGNOSIS — F411 Generalized anxiety disorder: Secondary | ICD-10-CM | POA: Diagnosis not present

## 2020-05-21 DIAGNOSIS — I10 Essential (primary) hypertension: Secondary | ICD-10-CM | POA: Diagnosis not present

## 2020-05-21 DIAGNOSIS — Z03818 Encounter for observation for suspected exposure to other biological agents ruled out: Secondary | ICD-10-CM | POA: Diagnosis not present

## 2020-05-21 DIAGNOSIS — Z20822 Contact with and (suspected) exposure to covid-19: Secondary | ICD-10-CM | POA: Diagnosis not present

## 2020-10-28 DIAGNOSIS — Z20822 Contact with and (suspected) exposure to covid-19: Secondary | ICD-10-CM | POA: Diagnosis not present

## 2020-10-29 DIAGNOSIS — E1129 Type 2 diabetes mellitus with other diabetic kidney complication: Secondary | ICD-10-CM | POA: Diagnosis not present

## 2020-10-29 DIAGNOSIS — Z01419 Encounter for gynecological examination (general) (routine) without abnormal findings: Secondary | ICD-10-CM | POA: Diagnosis not present

## 2021-03-15 ENCOUNTER — Other Ambulatory Visit: Payer: Self-pay | Admitting: Family Medicine

## 2021-03-15 DIAGNOSIS — Z1231 Encounter for screening mammogram for malignant neoplasm of breast: Secondary | ICD-10-CM

## 2021-03-16 ENCOUNTER — Ambulatory Visit
Admission: RE | Admit: 2021-03-16 | Discharge: 2021-03-16 | Disposition: A | Discharge status: Home / Self Care | Payer: BC Managed Care – PPO | Source: Ambulatory Visit | Attending: Family Medicine | Admitting: Family Medicine

## 2021-03-16 ENCOUNTER — Other Ambulatory Visit: Payer: Self-pay

## 2021-03-16 DIAGNOSIS — Z1231 Encounter for screening mammogram for malignant neoplasm of breast: Secondary | ICD-10-CM | POA: Diagnosis not present

## 2021-04-13 DIAGNOSIS — E119 Type 2 diabetes mellitus without complications: Secondary | ICD-10-CM | POA: Diagnosis not present

## 2021-04-13 DIAGNOSIS — H2513 Age-related nuclear cataract, bilateral: Secondary | ICD-10-CM | POA: Diagnosis not present

## 2021-04-13 DIAGNOSIS — Z794 Long term (current) use of insulin: Secondary | ICD-10-CM | POA: Diagnosis not present

## 2021-04-15 DIAGNOSIS — E1122 Type 2 diabetes mellitus with diabetic chronic kidney disease: Secondary | ICD-10-CM | POA: Diagnosis not present

## 2021-04-15 DIAGNOSIS — N181 Chronic kidney disease, stage 1: Secondary | ICD-10-CM | POA: Diagnosis not present

## 2021-04-15 DIAGNOSIS — E282 Polycystic ovarian syndrome: Secondary | ICD-10-CM | POA: Diagnosis not present

## 2021-04-15 DIAGNOSIS — E785 Hyperlipidemia, unspecified: Secondary | ICD-10-CM | POA: Diagnosis not present

## 2021-04-15 DIAGNOSIS — E881 Lipodystrophy, not elsewhere classified: Secondary | ICD-10-CM | POA: Diagnosis not present

## 2021-04-15 DIAGNOSIS — D649 Anemia, unspecified: Secondary | ICD-10-CM | POA: Diagnosis not present

## 2022-01-23 DIAGNOSIS — F419 Anxiety disorder, unspecified: Secondary | ICD-10-CM | POA: Diagnosis not present

## 2022-01-23 DIAGNOSIS — E7800 Pure hypercholesterolemia, unspecified: Secondary | ICD-10-CM | POA: Diagnosis not present

## 2022-01-23 DIAGNOSIS — E1169 Type 2 diabetes mellitus with other specified complication: Secondary | ICD-10-CM | POA: Diagnosis not present

## 2022-02-15 DIAGNOSIS — Z124 Encounter for screening for malignant neoplasm of cervix: Secondary | ICD-10-CM | POA: Diagnosis not present

## 2022-02-15 DIAGNOSIS — Z01419 Encounter for gynecological examination (general) (routine) without abnormal findings: Secondary | ICD-10-CM | POA: Diagnosis not present

## 2022-02-15 DIAGNOSIS — N898 Other specified noninflammatory disorders of vagina: Secondary | ICD-10-CM | POA: Diagnosis not present

## 2022-04-17 ENCOUNTER — Other Ambulatory Visit: Payer: Self-pay | Admitting: Family Medicine

## 2022-04-17 DIAGNOSIS — Z1231 Encounter for screening mammogram for malignant neoplasm of breast: Secondary | ICD-10-CM

## 2022-04-18 ENCOUNTER — Ambulatory Visit
Admission: RE | Admit: 2022-04-18 | Discharge: 2022-04-18 | Disposition: A | Discharge status: Home / Self Care | Payer: BC Managed Care – PPO | Source: Ambulatory Visit | Attending: Family Medicine | Admitting: Family Medicine

## 2022-04-18 DIAGNOSIS — Z1231 Encounter for screening mammogram for malignant neoplasm of breast: Secondary | ICD-10-CM

## 2022-04-19 DIAGNOSIS — H2513 Age-related nuclear cataract, bilateral: Secondary | ICD-10-CM | POA: Diagnosis not present

## 2022-04-19 DIAGNOSIS — Z794 Long term (current) use of insulin: Secondary | ICD-10-CM | POA: Diagnosis not present

## 2022-04-19 DIAGNOSIS — H5213 Myopia, bilateral: Secondary | ICD-10-CM | POA: Diagnosis not present

## 2022-04-19 DIAGNOSIS — E119 Type 2 diabetes mellitus without complications: Secondary | ICD-10-CM | POA: Diagnosis not present

## 2022-04-19 DIAGNOSIS — H52203 Unspecified astigmatism, bilateral: Secondary | ICD-10-CM | POA: Diagnosis not present

## 2022-04-19 DIAGNOSIS — H524 Presbyopia: Secondary | ICD-10-CM | POA: Diagnosis not present

## 2022-04-28 DIAGNOSIS — E881 Lipodystrophy, not elsewhere classified: Secondary | ICD-10-CM | POA: Diagnosis not present

## 2022-04-28 DIAGNOSIS — E210 Primary hyperparathyroidism: Secondary | ICD-10-CM | POA: Diagnosis not present

## 2022-04-28 DIAGNOSIS — E785 Hyperlipidemia, unspecified: Secondary | ICD-10-CM | POA: Diagnosis not present

## 2022-04-28 DIAGNOSIS — E282 Polycystic ovarian syndrome: Secondary | ICD-10-CM | POA: Diagnosis not present

## 2022-04-28 DIAGNOSIS — E1122 Type 2 diabetes mellitus with diabetic chronic kidney disease: Secondary | ICD-10-CM | POA: Diagnosis not present

## 2022-05-01 ENCOUNTER — Other Ambulatory Visit (HOSPITAL_COMMUNITY): Payer: Self-pay | Admitting: Internal Medicine

## 2022-05-01 ENCOUNTER — Other Ambulatory Visit: Payer: Self-pay | Admitting: Internal Medicine

## 2022-05-01 DIAGNOSIS — E210 Primary hyperparathyroidism: Secondary | ICD-10-CM

## 2022-05-08 ENCOUNTER — Ambulatory Visit (HOSPITAL_COMMUNITY)
Admission: RE | Admit: 2022-05-08 | Discharge: 2022-05-08 | Disposition: A | Discharge status: Home / Self Care | Payer: BC Managed Care – PPO | Source: Ambulatory Visit | Attending: Internal Medicine | Admitting: Internal Medicine

## 2022-05-08 ENCOUNTER — Encounter (HOSPITAL_COMMUNITY)
Admission: RE | Admit: 2022-05-08 | Discharge: 2022-05-08 | Disposition: A | Discharge status: Home / Self Care | Payer: BC Managed Care – PPO | Source: Ambulatory Visit | Attending: Internal Medicine | Admitting: Internal Medicine

## 2022-05-08 DIAGNOSIS — E210 Primary hyperparathyroidism: Secondary | ICD-10-CM | POA: Insufficient documentation

## 2022-05-08 MED ORDER — TECHNETIUM TC 99M SESTAMIBI GENERIC - CARDIOLITE
26.6000 | Freq: Once | INTRAVENOUS | Status: AC | PRN
  Administered 2022-05-08: 26.6 via INTRAVENOUS

## 2022-07-18 ENCOUNTER — Other Ambulatory Visit: Payer: Self-pay | Admitting: Surgery

## 2022-07-20 ENCOUNTER — Ambulatory Visit
Admission: RE | Admit: 2022-07-20 | Discharge: 2022-07-20 | Disposition: A | Discharge status: Home / Self Care | Payer: BC Managed Care – PPO | Source: Ambulatory Visit | Attending: Surgery | Admitting: Surgery

## 2022-07-20 DIAGNOSIS — E213 Hyperparathyroidism, unspecified: Secondary | ICD-10-CM | POA: Diagnosis not present

## 2022-07-20 DIAGNOSIS — E041 Nontoxic single thyroid nodule: Secondary | ICD-10-CM | POA: Diagnosis not present

## 2022-07-25 ENCOUNTER — Ambulatory Visit: Payer: Self-pay | Admitting: Surgery

## 2022-07-25 NOTE — Progress Notes (Signed)
USN confirms a right sided parathyroid adenoma.  This is consistent with the nuclear scan.  Will plan to proceed with minimally invasive outpatient parathyroidectomy as discussed in the office visit.  Will enter orders and send to schedulers to contact patient.  tmg  Armandina Gemma, [facility]  practice Office: [phone]

## 2022-07-28 ENCOUNTER — Encounter (HOSPITAL_COMMUNITY): Payer: Self-pay

## 2022-07-28 NOTE — Patient Instructions (Addendum)
DUE TO COVID-19 ONLY TWO VISITORS  (aged 50 and older)  ARE ALLOWED TO COME WITH YOU AND STAY IN THE WAITING ROOM ONLY DURING PRE OP AND PROCEDURE.   **NO VISITORS ARE ALLOWED IN THE SHORT STAY AREA OR RECOVERY ROOM!!**  IF YOU WILL BE ADMITTED INTO THE HOSPITAL YOU ARE ALLOWED ONLY FOUR SUPPORT PEOPLE DURING VISITATION HOURS ONLY (7 AM -8PM)   The support person(s) must pass our screening, gel in and out, and wear a mask at all times, including in the patient's room. Patients must also wear a mask when staff or their support person are in the room. Visitors GUEST BADGE MUST BE WORN VISIBLY  One adult visitor may remain with you overnight and MUST be in the room by 8 P.M.     Your procedure is scheduled on: 9/6//23   Report to [facility] Main Entrance    Report to admitting at 6:30 AM   Call this number if you have problems the morning of surgery [phone]   Do not eat food :After Midnight.   After Midnight you may have the following liquids until _5:30_____ AM/  DAY OF SURGERY  Water Black Coffee (sugar ok, NO MILK/CREAM OR CREAMERS)  Tea (sugar ok, NO MILK/CREAM OR CREAMERS) regular and decaf                             Plain Jell-O (NO RED)                                           Fruit ices (not with fruit pulp, NO RED)                                     Popsicles (NO RED)                                                                  Juice: apple, WHITE grape, WHITE cranberry Sports drinks like Gatorade (NO RED)            If you have questions, please contact your surgeon's office.             Oral Hygiene is also important to reduce your risk of infection.                                    Remember - BRUSH YOUR TEETH THE MORNING OF SURGERY WITH YOUR REGULAR TOOTHPASTE   Do NOT smoke after Midnight   Take these medicines the morning of surgery with A SIP OF WATER: Zoloft, Lipitor  How to Manage Your Diabetes Before and After Surgery  Why is it  important to control my blood sugar before and after surgery? Improving blood sugar levels before and after surgery helps healing and can limit problems. A way of improving blood sugar control is eating a healthy diet by:  Eating less sugar and carbohydrates  Increasing activity/exercise  Talking with your doctor about  reaching your blood sugar goals High blood sugars (greater than 180 mg/dL) can raise your risk of infections and slow your recovery, so you will need to focus on controlling your diabetes during the weeks before surgery. Make sure that the doctor who takes care of your diabetes knows about your planned surgery including the date and location.  How do I manage my blood sugar before surgery? Check your blood sugar at least 4 times a day, starting 2 days before surgery, to make sure that the level is not too high or low. Check your blood sugar the morning of your surgery when you wake up and every 2 hours until you get to the Short Stay unit. If your blood sugar is less than 70 mg/dL, you will need to treat for low blood sugar: Do not take insulin. Treat a low blood sugar (less than 70 mg/dL) with  cup of clear juice (cranberry or apple), 4 glucose tablets, OR glucose gel. Recheck blood sugar in 15 minutes after treatment (to make sure it is greater than 70 mg/dL). If your blood sugar is not greater than 70 mg/dL on recheck, call [phone] for further instructions. Report your blood sugar to the short stay nurse when you get to Short Stay.  If you are admitted to the hospital after surgery: Your blood sugar will be checked by the staff and you will probably be given insulin after surgery (instead of oral diabetes medicines) to make sure you have good blood sugar levels. The goal for blood sugar control after surgery is 80-180 mg/dL.   WHAT DO I DO ABOUT MY DIABETES MEDICATION? The Do not take oral diabetes medicines (pills) the morning of surgery.  THE NIGHT BEFORE SURGERY,  take 0 units of   Humulin  insulin. (No Bedtime dose)           If your CBG is greater than 220 mg/dL, you may take  of your sliding scale  (correction) dose of insulin.      Bring CPAP mask and tubing day of surgery.                              You may not have any metal on your body including hair pins, jewelry, and body piercing             Do not wear make-up, lotions, powders, perfumes/cologne, or deodorant  Do not wear nail polish including gel and S&S, artificial/acrylic nails, or any other type of covering on natural nails including finger and toenails. If you have artificial nails, gel coating, etc. that needs to be removed by a nail salon please have this removed prior to surgery or surgery may need to be canceled/ delayed if the surgeon/ anesthesia feels like they are unable to be safely monitored.   Do not shave  48 hours prior to surgery.     Do not bring valuables to the hospital. [place].   Contacts, dentures or bridgework may not be worn into surgery.   Bring small overnight bag day of surgery.   DO NOT [place]. PHARMACY WILL DISPENSE MEDICATIONS LISTED ON YOUR MEDICATION LIST TO YOU DURING YOUR ADMISSION [place]!    Patients discharged on the day of surgery will not be allowed to drive home.  Someone NEEDS  to stay with you for the first 24 hours after anesthesia.   Special Instructions: Bring a copy of your healthcare power of attorney and living will documents   the day of surgery if you haven't scanned them before.              Please read over the following fact sheets you were given: IF YOU HAVE QUESTIONS ABOUT YOUR PRE-OP INSTRUCTIONS PLEASE CALL [phone]     [facility] - Preparing for Surgery Before surgery, you can play an important role.  Because skin is not sterile, your skin needs to be as free of germs as possible.  You can reduce the number of germs on  your skin by washing with CHG (chlorahexidine gluconate) soap before surgery.  CHG is an antiseptic cleaner which kills germs and bonds with the skin to continue killing germs even after washing. Please DO NOT use if you have an allergy to CHG or antibacterial soaps.  If your skin becomes reddened/irritated stop using the CHG and inform your nurse when you arrive at Short Stay. Do not shave (including legs and underarms) for at least 48 hours prior to the first CHG shower.  You may shave your face/neck. Please follow these instructions carefully:  1.  Shower with CHG Soap the night before surgery and the  morning of Surgery.  2.  If you choose to wash your hair, wash your hair first as usual with your  normal  shampoo.  3.  After you shampoo, rinse your hair and body thoroughly to remove the  shampoo.                            4.  Use CHG as you would any other liquid soap.  You can apply chg directly  to the skin and wash                       Gently with a scrungie or clean washcloth.  5.  Apply the CHG Soap to your body ONLY FROM THE NECK DOWN.   Do not use on face/ open                           Wound or open sores. Avoid contact with eyes, ears mouth and genitals (private parts).                       Wash face,  Genitals (private parts) with your normal soap.             6.  Wash thoroughly, paying special attention to the area where your surgery  will be performed.  7.  Thoroughly rinse your body with warm water from the neck down.  8.  DO NOT shower/wash with your normal soap after using and rinsing off  the CHG Soap.                9.  Pat yourself dry with a clean towel.            10.  Wear clean pajamas.            11.  Place clean sheets on your bed the night of your first shower and do not  sleep with pets. Day of Surgery : Do not apply any lotions/deodorants the morning of surgery.  Please wear clean clothes to the hospital/surgery center.  FAILURE TO FOLLOW THESE INSTRUCTIONS  MAY RESULT IN THE CANCELLATION OF YOUR SURGERY    ________________________________________________________________________

## 2022-08-01 ENCOUNTER — Encounter (HOSPITAL_COMMUNITY): Payer: Self-pay

## 2022-08-01 ENCOUNTER — Other Ambulatory Visit: Payer: Self-pay

## 2022-08-01 ENCOUNTER — Encounter (HOSPITAL_COMMUNITY)
Admission: RE | Admit: 2022-08-01 | Discharge: 2022-08-01 | Disposition: A | Discharge status: Home / Self Care | Payer: BC Managed Care – PPO | Source: Ambulatory Visit | Attending: Surgery | Admitting: Surgery

## 2022-08-01 DIAGNOSIS — Z794 Long term (current) use of insulin: Secondary | ICD-10-CM | POA: Diagnosis not present

## 2022-08-01 DIAGNOSIS — Z01818 Encounter for other preprocedural examination: Secondary | ICD-10-CM | POA: Diagnosis not present

## 2022-08-01 DIAGNOSIS — E119 Type 2 diabetes mellitus without complications: Secondary | ICD-10-CM | POA: Insufficient documentation

## 2022-08-01 HISTORY — DX: Type 2 diabetes mellitus without complications: E11.9

## 2022-08-01 HISTORY — DX: Essential (primary) hypertension: I10

## 2022-08-01 HISTORY — DX: Hyperlipidemia, unspecified: E78.5

## 2022-08-01 HISTORY — DX: Primary hyperparathyroidism: E21.0

## 2022-08-01 LAB — CBC
HCT: 39.6 % (ref 36.0–46.0)
Hemoglobin: 12.7 g/dL (ref 12.0–15.0)
MCH: 26 pg (ref 26.0–34.0)
MCHC: 32.1 g/dL (ref 30.0–36.0)
MCV: 81 fL (ref 80.0–100.0)
Platelets: 331 10*3/uL (ref 150–400)
RBC: 4.89 MIL/uL (ref 3.87–5.11)
RDW: 13.8 % (ref 11.5–15.5)
WBC: 6.9 10*3/uL (ref 4.0–10.5)
nRBC: 0 % (ref 0.0–0.2)

## 2022-08-01 LAB — BASIC METABOLIC PANEL
Anion gap: 6 (ref 5–15)
BUN: 13 mg/dL (ref 6–20)
CO2: 25 mmol/L (ref 22–32)
Calcium: 10.9 mg/dL — ABNORMAL HIGH (ref 8.9–10.3)
Chloride: 105 mmol/L (ref 98–111)
Creatinine, Ser: 0.65 mg/dL (ref 0.44–1.00)
GFR, Estimated: >60 mL/min (ref >60)
Glucose, Bld: 190 mg/dL — ABNORMAL HIGH (ref 70–99)
Potassium: 4.1 mmol/L (ref 3.5–5.1)
Sodium: 136 mmol/L (ref 135–145)

## 2022-08-01 LAB — HEMOGLOBIN A1C
Hgb A1c MFr Bld: 7.4 % — ABNORMAL HIGH (ref 4.8–5.6)
Mean Plasma Glucose: 165.68 mg/dL

## 2022-08-01 LAB — GLUCOSE, CAPILLARY: Glucose-Capillary: 205 mg/dL — ABNORMAL HIGH (ref 70–99)

## 2022-08-01 NOTE — Progress Notes (Addendum)
Anesthesia note:  Bowel prep reminder:NA  PCP - Dr. Garlon Hatchet Cardiologist - Other- endocrinologist- Dr. Dagmar Hait  Chest x-ray - no EKG - 08/01/22-chart Stress Test - no ECHO - 2015-epic Cardiac Cath - no  Pacemaker/ICD device last checked:NA  Sleep Study - no CPAP -   Fasting Blood Sugar - 205 at PAT Checks Blood Sugar __twice a month___  Blood Thinner:NA Blood Thinner Instructions: Aspirin Instructions: Last Dose:  Anesthesia review: yes  Patient denies shortness of breath, fever, cough and chest pain at PAT appointment Pt has no SOB with activities. Pt doesn't stay as compliant with her insulin in the summer.I told her to monitor her CBGs and call Dr. Buddy Duty if they are high.  Patient verbalized understanding of instructions that were given to them at the PAT appointment. Patient was also instructed that they will need to review over the PAT instructions again at home before surgery.yes

## 2022-08-17 ENCOUNTER — Encounter (HOSPITAL_COMMUNITY): Payer: Self-pay | Admitting: Surgery

## 2022-08-17 DIAGNOSIS — E210 Primary hyperparathyroidism: Secondary | ICD-10-CM | POA: Diagnosis present

## 2022-08-17 NOTE — H&P (Signed)
REFERRING PHYSICIAN: Katina Degree, MD  PROVIDER: Aviv Lengacher Charlotta Newton, MD   Chief Complaint: New Consultation (Hyperparathyroidism/)  History of Present Illness:  Patient is referred by Dr. Dagmar Hait for surgical evaluation and management of suspected primary hyperparathyroidism. Patient has been noted to have minimally to mildly elevated calcium levels for some time. The highest recent level was 11.2. Intact PTH level has been in the upper range of normal but not elevated. Vitamin D level is normal. 24-hour urine collection for calcium was 300. Patient has had some level of fatigue. She denies osteopenia or osteoporosis but has not had a bone density scan. She denies nephrolithiasis. She does have some bone and joint discomfort. Patient has had no prior head or neck surgery. There is no family history of parathyroid disease or other endocrine neoplasm. Patient underwent nuclear medicine parathyroid scan in May 2023. This suggested the presence of a possible right sided parathyroid adenoma versus a right thyroid nodule. Ultrasound examination was recommended. 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 She is accompanied today by her husband.  Review of Systems: A complete review of systems was obtained from the patient. I have reviewed this information and discussed as appropriate with the patient. See HPI as well for other ROS.  Review of Systems  Constitutional: Positive for malaise/fatigue.  HENT: Negative.  Eyes: Negative.  Respiratory: Negative.  Cardiovascular: Negative.  Gastrointestinal: Negative.  Genitourinary: Negative.  Musculoskeletal: Positive for joint pain.  Skin: Negative.  Neurological: Negative.  Endo/Heme/Allergies: Negative.  Psychiatric/Behavioral: Negative.   Medical History: Past Medical History:  Diagnosis Date  Arthritis  Diabetes mellitus without complication (CMS-HCC)  Hyperlipidemia  Hypertension  Thyroid disease   Patient  Active Problem List  Diagnosis  Hypercalcemia   Past Surgical History:  Procedure Laterality Date  LAPAROSCOPIC TUBAL LIGATION 12/2008  CESAREAN SECTION  x 2 - 12/2003,12/2008    No Known Allergies  Current Outpatient Medications on File Prior to Visit  Medication Sig Dispense Refill  lisinopriL (ZESTRIL) 30 MG tablet 1/2 tablet daily  metFORMIN (GLUCOPHAGE) 1000 MG tablet 1 tablet twice daily  sertraline (ZOLOFT) 100 MG tablet Take 0.5 tablets by mouth once daily  TRULICITY 1.5 ZO/1.0 mL subcutaneous pen injector INJECT 1.5 MG UNDER THE SKIN ONCE A WEEK   No current facility-administered medications on file prior to visit.   History reviewed. No pertinent family history.   Social History   Tobacco Use  Smoking Status Never  Smokeless Tobacco Never    Social History   Socioeconomic History  Marital status: Married  Tobacco Use  Smoking status: Never  Smokeless tobacco: Never  Substance and Sexual Activity  Alcohol use: Not Currently  Drug use: Never   Objective:   Vitals:  BP: 122/60  Pulse: 100  Temp: 36.2 C (97.1 F)  SpO2: 98%  Weight: 70.9 kg (156 lb 6.4 oz)  Height: 167.6 cm ('5\' 6"'$ )   Body mass index is 25.24 kg/m.  Physical Exam   GENERAL APPEARANCE Comfortable, no acute issues Development: normal Gross deformities: none  SKIN Rash, lesions, ulcers: none Induration, erythema: none Nodules: none palpable  EYES Conjunctiva and lids: normal Pupils: equal and reactive  EARS, NOSE, MOUTH, THROAT External ears: no lesion or deformity External nose: no lesion or deformity Hearing: grossly normal  NECK Symmetric: yes Trachea: midline Thyroid: no palpable nodules in the thyroid bed  CHEST Respiratory effort: normal Retraction or accessory muscle use: no Breath sounds: normal bilaterally Rales, rhonchi,  wheeze: none  CARDIOVASCULAR Auscultation: regular rhythm, normal rate Murmurs: none Pulses: radial pulse 2+ palpable Lower  extremity edema: none  ABDOMEN Not assessed  GENITOURINARY/RECTAL Not assessed  MUSCULOSKELETAL Station and gait: normal Digits and nails: no clubbing or cyanosis Muscle strength: grossly normal all extremities Range of motion: grossly normal all extremities Deformity: none  LYMPHATIC Cervical: none palpable Supraclavicular: none palpable  PSYCHIATRIC Oriented to person, place, and time: yes Mood and affect: normal for situation Judgment and insight: appropriate for situation    Assessment and Plan:   Hypercalcemia  Patient presents today on referral from her endocrinologist for surgical evaluation and recommendations regarding management of hypercalcemia and possibly primary hyperparathyroidism.  Patient provided with a copy of "Parathyroid Surgery: Treatment for Your Parathyroid Gland Problem", published by [employer], 12 pages. Book reviewed and explained to patient during visit today.  Patient does have biochemical evidence of primary hyperparathyroidism. She may have complications including fatigue and bone and joint discomfort. I would like to proceed with additional imaging to include an ultrasound examination of the neck. We will compare this with the nuclear medicine parathyroid scan which was performed in May 2023. This suggested a possible right sided parathyroid adenoma.  Patient will be scheduled for ultrasound examination of the neck. Results will be available on MyChart and I will contact her with the results to discuss further management. If the ultrasound confirms the presence of a parathyroid adenoma, then I believe she will be a good candidate for minimally invasive outpatient parathyroidectomy. We discussed this procedure today and I provided her with written literature on parathyroid surgery to review at home. If the ultrasound does not confirm the position of the adenoma, then we will proceed with a 4D CT scan of the neck in hopes of confirming her  diagnosis.  Patient will undergo ultrasound exam. We will contact her with the results when they are available.   Armandina Gemma, MD [facility] [place] practice Office: [phone]

## 2022-08-22 NOTE — Anesthesia Preprocedure Evaluation (Addendum)
Anesthesia Evaluation  Patient identified by MRN, date of birth, ID band Patient awake    Reviewed: Allergy & Precautions, NPO status , Patient's Chart, lab work & pertinent test results  History of Anesthesia Complications (+) history of anesthetic complications  Airway Mallampati: III  TM Distance: >3 FB Neck ROM: Full    Dental no notable dental hx.    Pulmonary neg pulmonary ROS,    Pulmonary exam normal        Cardiovascular hypertension, Pt. on medications Normal cardiovascular exam     Neuro/Psych negative neurological ROS  negative psych ROS   GI/Hepatic negative GI ROS, Neg liver ROS,   Endo/Other  diabetes (on Trulicity), Type 2, Oral Hypoglycemic Agents, Insulin Dependent  Renal/GU negative Renal ROS  negative genitourinary   Musculoskeletal negative musculoskeletal ROS (+)   Abdominal   Peds  Hematology negative hematology ROS (+)   Anesthesia Other Findings Day of surgery medications reviewed with patient.  Reproductive/Obstetrics negative OB ROS                            Anesthesia Physical Anesthesia Plan  ASA: 2  Anesthesia Plan: General   Post-op Pain Management: Tylenol PO (pre-op)*   Induction: Intravenous and Rapid sequence  PONV Risk Score and Plan: 3 and Treatment may vary due to age or medical condition, Ondansetron, Dexamethasone and Midazolam  Airway Management Planned: Oral ETT  Additional Equipment: None  Intra-op Plan:   Post-operative Plan: Extubation in OR  Informed Consent: I have reviewed the patients History and Physical, chart, labs and discussed the procedure including the risks, benefits and alternatives for the proposed anesthesia with the patient or authorized representative who has indicated his/her understanding and acceptance.     Dental advisory given  Plan Discussed with: CRNA  Anesthesia Plan Comments: (Last took Trulicity  on 06/24/28, plan for RSI. Daiva Huge MD)      Anesthesia Quick Evaluation

## 2022-08-23 ENCOUNTER — Ambulatory Visit (HOSPITAL_COMMUNITY): Payer: BC Managed Care – PPO | Admitting: Physician Assistant

## 2022-08-23 ENCOUNTER — Ambulatory Visit (HOSPITAL_COMMUNITY)
Admission: RE | Admit: 2022-08-23 | Discharge: 2022-08-23 | Disposition: A | Discharge status: Home / Self Care | Payer: BC Managed Care – PPO | Attending: Surgery | Admitting: Surgery

## 2022-08-23 ENCOUNTER — Encounter (HOSPITAL_COMMUNITY): Payer: Self-pay | Admitting: Surgery

## 2022-08-23 ENCOUNTER — Other Ambulatory Visit: Payer: Self-pay

## 2022-08-23 ENCOUNTER — Encounter (HOSPITAL_COMMUNITY)
Admission: RE | Disposition: A | Discharge status: Home / Self Care | Payer: Self-pay | Source: Home / Self Care | Attending: Surgery

## 2022-08-23 ENCOUNTER — Ambulatory Visit (HOSPITAL_COMMUNITY): Payer: BC Managed Care – PPO | Admitting: Anesthesiology

## 2022-08-23 DIAGNOSIS — D351 Benign neoplasm of parathyroid gland: Secondary | ICD-10-CM | POA: Diagnosis not present

## 2022-08-23 DIAGNOSIS — Z7984 Long term (current) use of oral hypoglycemic drugs: Secondary | ICD-10-CM | POA: Diagnosis not present

## 2022-08-23 DIAGNOSIS — E119 Type 2 diabetes mellitus without complications: Secondary | ICD-10-CM | POA: Diagnosis not present

## 2022-08-23 DIAGNOSIS — E213 Hyperparathyroidism, unspecified: Secondary | ICD-10-CM | POA: Diagnosis not present

## 2022-08-23 DIAGNOSIS — Z794 Long term (current) use of insulin: Secondary | ICD-10-CM | POA: Diagnosis not present

## 2022-08-23 DIAGNOSIS — E210 Primary hyperparathyroidism: Secondary | ICD-10-CM | POA: Insufficient documentation

## 2022-08-23 DIAGNOSIS — I10 Essential (primary) hypertension: Secondary | ICD-10-CM | POA: Diagnosis not present

## 2022-08-23 HISTORY — PX: PARATHYROIDECTOMY: SHX19

## 2022-08-23 LAB — GLUCOSE, CAPILLARY
Glucose-Capillary: 127 mg/dL — ABNORMAL HIGH (ref 70–99)
Glucose-Capillary: 142 mg/dL — ABNORMAL HIGH (ref 70–99)

## 2022-08-23 LAB — POCT PREGNANCY, URINE: Preg Test, Ur: NEGATIVE

## 2022-08-23 SURGERY — PARATHYROIDECTOMY
Anesthesia: General | Laterality: Right

## 2022-08-23 MED ORDER — EPHEDRINE SULFATE-NACL 50-0.9 MG/10ML-% IV SOSY
PREFILLED_SYRINGE | INTRAVENOUS | Status: DC | PRN
  Administered 2022-08-23 (×3): 5 mg via INTRAVENOUS

## 2022-08-23 MED ORDER — FENTANYL CITRATE (PF) 250 MCG/5ML IJ SOLN
INTRAMUSCULAR | Status: DC | PRN
  Administered 2022-08-23: 50 ug via INTRAVENOUS
  Administered 2022-08-23: 100 ug via INTRAVENOUS
  Administered 2022-08-23 (×2): 50 ug via INTRAVENOUS

## 2022-08-23 MED ORDER — CEFAZOLIN SODIUM-DEXTROSE 2-4 GM/100ML-% IV SOLN
INTRAVENOUS | Status: AC
  Filled 2022-08-23: qty 100

## 2022-08-23 MED ORDER — HEMOSTATIC AGENTS (NO CHARGE) OPTIME
TOPICAL | Status: DC | PRN
  Administered 2022-08-23: 1

## 2022-08-23 MED ORDER — PROPOFOL 10 MG/ML IV BOLUS
INTRAVENOUS | Status: AC
Start: 2022-08-23 — End: ?
  Filled 2022-08-23: qty 20

## 2022-08-23 MED ORDER — ORAL CARE MOUTH RINSE: 15.0000 mL | Freq: Once | OROMUCOSAL | Status: AC

## 2022-08-23 MED ORDER — DROPERIDOL 2.5 MG/ML IJ SOLN: 0.6250 mg | Freq: Once | INTRAMUSCULAR | Status: DC | PRN

## 2022-08-23 MED ORDER — FENTANYL CITRATE PF 50 MCG/ML IJ SOSY: 25.0000 ug | PREFILLED_SYRINGE | INTRAMUSCULAR | Status: DC | PRN

## 2022-08-23 MED ORDER — CHLORHEXIDINE GLUCONATE 0.12 % MT SOLN
15.0000 mL | Freq: Once | OROMUCOSAL | Status: AC
  Administered 2022-08-23: 15 mL via OROMUCOSAL

## 2022-08-23 MED ORDER — LIDOCAINE HCL (CARDIAC) PF 100 MG/5ML IV SOSY
PREFILLED_SYRINGE | INTRAVENOUS | Status: DC | PRN
  Administered 2022-08-23: 80 mg via INTRAVENOUS

## 2022-08-23 MED ORDER — BUPIVACAINE HCL (PF) 0.25 % IJ SOLN
INTRAMUSCULAR | Status: AC
  Filled 2022-08-23: qty 30

## 2022-08-23 MED ORDER — ONDANSETRON HCL 4 MG/2ML IJ SOLN
INTRAMUSCULAR | Status: DC | PRN
  Administered 2022-08-23: 4 mg via INTRAVENOUS

## 2022-08-23 MED ORDER — BUPIVACAINE HCL 0.25 % IJ SOLN
INTRAMUSCULAR | Status: DC | PRN
  Administered 2022-08-23: 10 mL

## 2022-08-23 MED ORDER — LIDOCAINE HCL (PF) 2 % IJ SOLN
INTRAMUSCULAR | Status: AC
  Filled 2022-08-23: qty 5

## 2022-08-23 MED ORDER — MIDAZOLAM HCL 2 MG/2ML IJ SOLN
INTRAMUSCULAR | Status: DC | PRN
  Administered 2022-08-23: 2 mg via INTRAVENOUS

## 2022-08-23 MED ORDER — ACETAMINOPHEN 500 MG PO TABS
ORAL_TABLET | ORAL | Status: AC
  Administered 2022-08-23: 1000 mg via ORAL
  Filled 2022-08-23: qty 2

## 2022-08-23 MED ORDER — TRAMADOL HCL 50 MG PO TABS: 50.0000 mg | ORAL_TABLET | Freq: Four times a day (QID) | ORAL | 0 refills | Status: AC | PRN

## 2022-08-23 MED ORDER — ROCURONIUM BROMIDE 10 MG/ML (PF) SYRINGE
PREFILLED_SYRINGE | INTRAVENOUS | Status: AC
  Filled 2022-08-23: qty 10

## 2022-08-23 MED ORDER — CHLORHEXIDINE GLUCONATE CLOTH 2 % EX PADS: 6.0000 | MEDICATED_PAD | Freq: Once | CUTANEOUS | Status: DC

## 2022-08-23 MED ORDER — ROCURONIUM BROMIDE 10 MG/ML (PF) SYRINGE
PREFILLED_SYRINGE | INTRAVENOUS | Status: DC | PRN
  Administered 2022-08-23 (×3): 20 mg via INTRAVENOUS

## 2022-08-23 MED ORDER — MIDAZOLAM HCL 2 MG/2ML IJ SOLN
INTRAMUSCULAR | Status: AC
  Filled 2022-08-23: qty 2

## 2022-08-23 MED ORDER — 0.9 % SODIUM CHLORIDE (POUR BTL) OPTIME
TOPICAL | Status: DC | PRN
  Administered 2022-08-23: 1000 mL

## 2022-08-23 MED ORDER — ACETAMINOPHEN 500 MG PO TABS: 1000.0000 mg | ORAL_TABLET | Freq: Once | ORAL | Status: AC

## 2022-08-23 MED ORDER — FENTANYL CITRATE (PF) 250 MCG/5ML IJ SOLN
INTRAMUSCULAR | Status: AC
  Filled 2022-08-23: qty 5

## 2022-08-23 MED ORDER — OXYCODONE HCL 5 MG PO TABS: 5.0000 mg | ORAL_TABLET | Freq: Once | ORAL | Status: DC | PRN

## 2022-08-23 MED ORDER — LACTATED RINGERS IV SOLN
INTRAVENOUS | Status: DC
Start: 2022-08-23 — End: 2022-08-23

## 2022-08-23 MED ORDER — SUCCINYLCHOLINE CHLORIDE 200 MG/10ML IV SOSY
PREFILLED_SYRINGE | INTRAVENOUS | Status: DC | PRN
  Administered 2022-08-23: 100 mg via INTRAVENOUS

## 2022-08-23 MED ORDER — PHENYLEPHRINE 80 MCG/ML (10ML) SYRINGE FOR IV PUSH (FOR BLOOD PRESSURE SUPPORT)
PREFILLED_SYRINGE | INTRAVENOUS | Status: DC | PRN
  Administered 2022-08-23 (×2): 120 ug via INTRAVENOUS
  Administered 2022-08-23: 80 ug via INTRAVENOUS
  Administered 2022-08-23: 120 ug via INTRAVENOUS

## 2022-08-23 MED ORDER — OXYCODONE HCL 5 MG/5ML PO SOLN: 5.0000 mg | Freq: Once | ORAL | Status: DC | PRN

## 2022-08-23 MED ORDER — CEFAZOLIN SODIUM-DEXTROSE 2-4 GM/100ML-% IV SOLN
2.0000 g | INTRAVENOUS | Status: AC
  Administered 2022-08-23: 2 g via INTRAVENOUS

## 2022-08-23 MED ORDER — DEXAMETHASONE SODIUM PHOSPHATE 10 MG/ML IJ SOLN
INTRAMUSCULAR | Status: DC | PRN
  Administered 2022-08-23: 5 mg via INTRAVENOUS

## 2022-08-23 MED ORDER — SUGAMMADEX SODIUM 200 MG/2ML IV SOLN
INTRAVENOUS | Status: DC | PRN
  Administered 2022-08-23: 150 mg via INTRAVENOUS

## 2022-08-23 MED ORDER — PROPOFOL 10 MG/ML IV BOLUS
INTRAVENOUS | Status: DC | PRN
  Administered 2022-08-23: 30 mg via INTRAVENOUS
  Administered 2022-08-23: 140 mg via INTRAVENOUS
  Administered 2022-08-23: 30 mg via INTRAVENOUS

## 2022-08-23 SURGICAL SUPPLY — 33 items
ATTRACTOMAT 16X20 MAGNETIC DRP (DRAPES) ×1 IMPLANT
BAG COUNTER SPONGE SURGICOUNT (BAG) ×1 IMPLANT
BLADE SURG 15 STRL LF DISP TIS (BLADE) ×1 IMPLANT
BLADE SURG 15 STRL SS (BLADE) ×1
CHLORAPREP W/TINT 26 (MISCELLANEOUS) ×1 IMPLANT
CLIP TI MEDIUM 6 (CLIP) ×2 IMPLANT
CLIP TI WIDE RED SMALL 6 (CLIP) ×2 IMPLANT
COVER SURGICAL LIGHT HANDLE (MISCELLANEOUS) ×1 IMPLANT
DERMABOND ADVANCED (GAUZE/BANDAGES/DRESSINGS) ×1
DERMABOND ADVANCED .7 DNX12 (GAUZE/BANDAGES/DRESSINGS) ×1 IMPLANT
DRAPE LAPAROTOMY T 98X78 PEDS (DRAPES) ×1 IMPLANT
DRAPE UTILITY XL STRL (DRAPES) ×1 IMPLANT
ELECT REM PT RETURN 15FT ADLT (MISCELLANEOUS) ×1 IMPLANT
GAUZE 4X4 16PLY ~~LOC~~+RFID DBL (SPONGE) ×1 IMPLANT
GLOVE SURG ORTHO 8.0 STRL STRW (GLOVE) ×1 IMPLANT
GOWN STRL REUS W/ TWL XL LVL3 (GOWN DISPOSABLE) ×3 IMPLANT
GOWN STRL REUS W/TWL XL LVL3 (GOWN DISPOSABLE) ×3
HEMOSTAT SURGICEL 2X4 FIBR (HEMOSTASIS) ×1 IMPLANT
ILLUMINATOR WAVEGUIDE N/F (MISCELLANEOUS) IMPLANT
KIT BASIN OR (CUSTOM PROCEDURE TRAY) ×1 IMPLANT
KIT TURNOVER KIT A (KITS) IMPLANT
NDL HYPO 25X1 1.5 SAFETY (NEEDLE) ×1 IMPLANT
NEEDLE HYPO 25X1 1.5 SAFETY (NEEDLE) ×1 IMPLANT
PACK BASIC VI WITH GOWN DISP (CUSTOM PROCEDURE TRAY) ×1 IMPLANT
PENCIL SMOKE EVACUATOR (MISCELLANEOUS) ×1 IMPLANT
SHEARS HARMONIC 9CM CVD (BLADE) IMPLANT
SUT MNCRL AB 4-0 PS2 18 (SUTURE) ×1 IMPLANT
SUT VIC AB 3-0 SH 18 (SUTURE) ×1 IMPLANT
SYR BULB IRRIG 60ML STRL (SYRINGE) ×1 IMPLANT
SYR CONTROL 10ML LL (SYRINGE) ×1 IMPLANT
TOWEL OR 17X26 10 PK STRL BLUE (TOWEL DISPOSABLE) ×1 IMPLANT
TOWEL OR NON WOVEN STRL DISP B (DISPOSABLE) ×1 IMPLANT
TUBING CONNECTING 10 (TUBING) ×1 IMPLANT

## 2022-08-23 NOTE — Anesthesia Procedure Notes (Signed)
Procedure Name: Intubation Date/Time: 08/23/2022 8:37 AM  Performed by: Raenette Rover, CRNAPre-anesthesia Checklist: Patient identified, Emergency Drugs available, Suction available and Patient being monitored Patient Re-evaluated:Patient Re-evaluated prior to induction Oxygen Delivery Method: Circle system utilized Preoxygenation: Pre-oxygenation with 100% oxygen Induction Type: IV induction and Rapid sequence Ventilation: Mask ventilation without difficulty Laryngoscope Size: Glidescope and 3 Grade View: Grade I Tube type: Oral Tube size: 7.0 mm Number of attempts: 2 Airway Equipment and Method: Stylet Placement Confirmation: ETT inserted through vocal cords under direct vision, positive ETCO2 and breath sounds checked- equal and bilateral Secured at: 21 cm Tube secured with: Tape Dental Injury: Teeth and Oropharynx as per pre-operative assessment and Injury to lip  Comments: DL x1 with MAC 3 with Grade 3 view. Easy mask ventilation. Glidescope 3 produced Grade 1 view with easily passed ETT. After DL, small cut on lip noticed. Pressure held and lube applied.

## 2022-08-23 NOTE — Transfer of Care (Signed)
Immediate Anesthesia Transfer of Care Note  Patient: [name]  Procedure(s) Performed: RIGHT PARATHYROIDECTOMY (Right)  Patient Location: PACU  Anesthesia Type:General  Level of Consciousness: awake, alert , oriented and patient cooperative  Airway & Oxygen Therapy: Patient Spontanous Breathing and Patient connected to face mask oxygen  Post-op Assessment: Report given to RN and Post -op Vital signs reviewed and stable  Post vital signs: Reviewed and stable  Last Vitals:  Vitals Value Taken Time  BP 153/99 08/23/22 1007  Temp 36.6 C 08/23/22 1007  Pulse 101 08/23/22 1009  Resp 14 08/23/22 1009  SpO2 100 % 08/23/22 1009  Vitals shown include unvalidated device data.  Last Pain:  Vitals:   08/23/22 0708  TempSrc: Oral  PainSc:          Complications: No notable events documented.

## 2022-08-23 NOTE — Discharge Instructions (Signed)
CENTRAL  SURGERY - Dr. Rasheena Talmadge  THYROID & PARATHYROID SURGERY:  POST-OP INSTRUCTIONS  Always review the instruction sheet provided by the hospital nurse at discharge.  A prescription for pain medication may be sent to your pharmacy at the time of discharge.  Take your pain medication as prescribed.  If narcotic pain medicine is not needed, then you may take acetaminophen (Tylenol) or ibuprofen (Advil) as needed for pain or soreness.  Take your normal home medications as prescribed unless otherwise directed.  If you need a refill on your pain medication, please contact the office during regular business hours.  Prescriptions will not be processed by the office after 5:00PM or on weekends.  Start with a light diet upon arrival home, such as soup and crackers or toast.  Be sure to drink plenty of fluids.  Resume your normal diet the day after surgery.  Most patients will experience some swelling and bruising on the chest and neck area.  Ice packs will help for the first 48 hours after arriving home.  Swelling and bruising will take several days to resolve.   It is common to experience some constipation after surgery.  Increasing fluid intake and taking a stool softener (Colace) will usually help to prevent this problem.  A mild laxative (Milk of Magnesia or Miralax) should be taken according to package directions if there has been no bowel movement after 48 hours.  Dermabond glue covers your incision. This seals the wound and you may shower at any time. The Dermabond will remain in place for about a week.  You may gradually remove the glue when it loosens around the edges.  If you need to loosen the Dermabond for removal, apply a layer of Vaseline to the wound for 15 minutes and then remove with a Kleenex. Your sutures are under the skin and will not show - they will dissolve on their own.  You may resume light daily activities beginning the day after discharge (such as self-care,  walking, climbing stairs), gradually increasing activities as tolerated. You may have sexual intercourse when it is comfortable. Refrain from any heavy lifting or straining until approved by your doctor. You may drive when you no longer are taking prescription pain medication, you can comfortably wear a seatbelt, and you can safely maneuver your car and apply the brakes.  You will see your doctor in the office for a follow-up appointment approximately three weeks after your surgery.  Make sure that you call for this appointment within a day or two after you arrive home to insure a convenient appointment time. Please have any requested laboratory tests performed a few days prior to your office visit so that the results will be available at your follow up appointment.  WHEN TO CALL THE [facility] OFFICE: -- Fever greater than 101.5 -- Inability to urinate -- Nausea and/or vomiting - persistent -- Extreme swelling or bruising -- Continued bleeding from incision -- Increased pain, redness, or drainage from the incision -- Difficulty swallowing or breathing -- Muscle cramping or spasms -- Numbness or tingling in hands or around lips  The clinic staff is available to answer your questions during regular business hours.  Please don't hesitate to call and ask to speak to one of the nurses if you have concerns.  [facility] OFFICE: [phone] (24 hours)  Please sign up for MyChart accounts. This will allow you to communicate directly with my nurse or myself without having to call the office. It will also allow you   to view your test results. You will need to enroll in MyChart for my office ([facility]) and for the hospital ([place]).  Journey Ratterman, MD [facility] A [facility] 

## 2022-08-23 NOTE — Anesthesia Postprocedure Evaluation (Signed)
Anesthesia Post Note  Patient: [name]  Procedure(s) Performed: RIGHT PARATHYROIDECTOMY (Right)     Patient location during evaluation: PACU Anesthesia Type: General Level of consciousness: awake and alert Pain management: pain level controlled Vital Signs Assessment: post-procedure vital signs reviewed and stable Respiratory status: spontaneous breathing, nonlabored ventilation and respiratory function stable Cardiovascular status: blood pressure returned to baseline Postop Assessment: no apparent nausea or vomiting Anesthetic complications: no   No notable events documented.  Last Vitals:  Vitals:   08/23/22 1045 08/23/22 1100  BP: (!) 150/95 (!) 144/78  Pulse: 100 96  Resp: 18 16  Temp:    SpO2: 95% 94%    Last Pain:  Vitals:   08/23/22 1045  TempSrc:   PainSc: 2                  Marthenia Rolling

## 2022-08-23 NOTE — Interval H&P Note (Signed)
History and Physical Interval Note:  08/23/2022 7:48 AM  [name]  has presented today for surgery, with the diagnosis of PRIMARY HYPERPARATHYROIDISM.  The various methods of treatment have been discussed with the patient and family. After consideration of risks, benefits and other options for treatment, the patient has consented to    Procedure(s): RIGHT PARATHYROIDECTOMY (Right) as a surgical intervention.    The patient's history has been reviewed, patient examined, no change in status, stable for surgery.  I have reviewed the patient's chart and labs.  Questions were answered to the patient's satisfaction.    Armandina Gemma, [facility] [place] [facility] Office: [place]

## 2022-08-23 NOTE — Op Note (Signed)
OPERATIVE REPORT - PARATHYROIDECTOMY  Preoperative diagnosis: Primary hyperparathyroidism  Postop diagnosis: Same  Procedure: Right superior minimally invasive parathyroidectomy  Surgeon:  Armandina Gemma, MD  Assistant:  Malachi Pro, PA-C  Anesthesia: General endotracheal  Estimated blood loss: Minimal  Preparation: ChloraPrep  Indications: Patient is referred by Dr. Dagmar Hait for surgical evaluation and management of suspected primary hyperparathyroidism. Patient has been noted to have minimally to mildly elevated calcium levels for some time. The highest recent level was 11.2. Intact PTH level has been in the upper range of normal but not elevated. Vitamin D level is normal. 24-hour urine collection for calcium was 300. Patient has had some level of fatigue. She denies osteopenia or osteoporosis but has not had a bone density scan. She denies nephrolithiasis. She does have some bone and joint discomfort. Patient has had no prior head or neck surgery. There is no family history of parathyroid disease or other endocrine neoplasm. Patient underwent nuclear medicine parathyroid scan in May 2023. This suggested the presence of a possible right sided parathyroid adenoma versus a right thyroid nodule. Ultrasound examination demonstrated a 2.2 cm nodule posterior to the right thyroid lobed consistent with an adenoma.  Patient now comes to surgery for resection.  Procedure: The patient was prepared in the pre-operative holding area. The patient was brought to the operating room and placed in a supine position on the operating room table. Following administration of general anesthesia, the patient was positioned and then prepped and draped in the usual strict aseptic fashion. After ascertaining that an adequate level of anesthesia been achieved, a neck incision was made with a #15 blade. Dissection was carried through subcutaneous tissues and platysma. Hemostasis was obtained with the electrocautery. Skin  flaps were developed circumferentially and a Weitlander retractor was placed for exposure.  Strap muscles were incised in the midline. Strap muscles were reflected laterally exposing the thyroid lobe. With gentle blunt dissection the right thyroid lobe was mobilized.  Dissection was carried posteriorly and an enlarged parathyroid gland was identified posterior to the right superior pole. It was gently mobilized. Vascular structures were divided between small ligaclips. The parathyroid was somewhat adherent to the thyroid gland and the Harmonic scalpel was used for excision.  Care was taken to avoid the recurrent laryngeal nerve which was identified posterior and medially to the parathyroid adenoma. The parathyroid gland was completely excised. It was submitted to pathology where frozen section confirmed hypercellular parathyroid tissue consistent with adenoma.  Neck was irrigated with warm saline and good hemostasis was noted. Fibrillar was placed in the operative field. Strap muscles were approximated in the midline with interrupted 3-0 Vicryl sutures. Platysma was closed with interrupted 3-0 Vicryl sutures. Marcaine was infiltrated circumferentially. Skin was closed with a running 4-0 Monocryl subcuticular suture. Wound was washed and dried and Dermabond was applied. Patient was awakened from anesthesia and brought to the recovery room. The patient tolerated the procedure well.   Armandina Gemma, [facility] Office: [phone]

## 2022-08-24 ENCOUNTER — Encounter (HOSPITAL_COMMUNITY): Payer: Self-pay | Admitting: Surgery

## 2022-08-25 LAB — SURGICAL PATHOLOGY

## 2022-08-25 NOTE — Progress Notes (Signed)
Pathology as expected.  Large adenoma.  Will check labs prior to [place], [facility] [place] practice Office: [phone]

## 2022-09-06 DIAGNOSIS — Z1211 Encounter for screening for malignant neoplasm of colon: Secondary | ICD-10-CM | POA: Diagnosis not present

## 2022-09-06 DIAGNOSIS — R197 Diarrhea, unspecified: Secondary | ICD-10-CM | POA: Diagnosis not present

## 2022-09-06 DIAGNOSIS — K648 Other hemorrhoids: Secondary | ICD-10-CM | POA: Diagnosis not present

## 2022-09-21 DIAGNOSIS — R197 Diarrhea, unspecified: Secondary | ICD-10-CM | POA: Diagnosis not present

## 2022-09-21 DIAGNOSIS — E119 Type 2 diabetes mellitus without complications: Secondary | ICD-10-CM | POA: Diagnosis not present

## 2022-09-26 DIAGNOSIS — R197 Diarrhea, unspecified: Secondary | ICD-10-CM | POA: Diagnosis not present

## 2022-09-27 DIAGNOSIS — E892 Postprocedural hypoparathyroidism: Secondary | ICD-10-CM | POA: Diagnosis not present

## 2022-09-27 DIAGNOSIS — R197 Diarrhea, unspecified: Secondary | ICD-10-CM | POA: Diagnosis not present

## 2023-02-21 DIAGNOSIS — Z01419 Encounter for gynecological examination (general) (routine) without abnormal findings: Secondary | ICD-10-CM | POA: Diagnosis not present

## 2023-04-19 ENCOUNTER — Other Ambulatory Visit: Payer: Self-pay | Admitting: Family Medicine

## 2023-04-19 DIAGNOSIS — Z1231 Encounter for screening mammogram for malignant neoplasm of breast: Secondary | ICD-10-CM

## 2023-04-20 DIAGNOSIS — E1169 Type 2 diabetes mellitus with other specified complication: Secondary | ICD-10-CM | POA: Diagnosis not present

## 2023-04-20 DIAGNOSIS — F411 Generalized anxiety disorder: Secondary | ICD-10-CM | POA: Diagnosis not present

## 2023-04-20 DIAGNOSIS — E7800 Pure hypercholesterolemia, unspecified: Secondary | ICD-10-CM | POA: Diagnosis not present

## 2023-04-30 DIAGNOSIS — E113291 Type 2 diabetes mellitus with mild nonproliferative diabetic retinopathy without macular edema, right eye: Secondary | ICD-10-CM | POA: Diagnosis not present

## 2023-04-30 DIAGNOSIS — H52223 Regular astigmatism, bilateral: Secondary | ICD-10-CM | POA: Diagnosis not present

## 2023-04-30 DIAGNOSIS — H524 Presbyopia: Secondary | ICD-10-CM | POA: Diagnosis not present

## 2023-04-30 DIAGNOSIS — H5213 Myopia, bilateral: Secondary | ICD-10-CM | POA: Diagnosis not present

## 2023-05-29 ENCOUNTER — Ambulatory Visit
Admission: RE | Admit: 2023-05-29 | Discharge: 2023-05-29 | Disposition: A | Discharge status: Home / Self Care | Payer: BC Managed Care – PPO | Source: Ambulatory Visit | Attending: Family Medicine | Admitting: Family Medicine

## 2023-05-29 DIAGNOSIS — Z1231 Encounter for screening mammogram for malignant neoplasm of breast: Secondary | ICD-10-CM

## 2023-08-31 DIAGNOSIS — E282 Polycystic ovarian syndrome: Secondary | ICD-10-CM | POA: Diagnosis not present

## 2023-08-31 DIAGNOSIS — Z8639 Personal history of other endocrine, nutritional and metabolic disease: Secondary | ICD-10-CM | POA: Diagnosis not present

## 2023-08-31 DIAGNOSIS — E881 Lipodystrophy, not elsewhere classified: Secondary | ICD-10-CM | POA: Diagnosis not present

## 2023-08-31 DIAGNOSIS — E785 Hyperlipidemia, unspecified: Secondary | ICD-10-CM | POA: Diagnosis not present

## 2023-08-31 DIAGNOSIS — E1122 Type 2 diabetes mellitus with diabetic chronic kidney disease: Secondary | ICD-10-CM | POA: Diagnosis not present

## 2023-10-31 DIAGNOSIS — E113293 Type 2 diabetes mellitus with mild nonproliferative diabetic retinopathy without macular edema, bilateral: Secondary | ICD-10-CM | POA: Diagnosis not present

## 2023-10-31 DIAGNOSIS — Z794 Long term (current) use of insulin: Secondary | ICD-10-CM | POA: Diagnosis not present

## 2024-01-31 DIAGNOSIS — F411 Generalized anxiety disorder: Secondary | ICD-10-CM | POA: Diagnosis not present

## 2024-01-31 DIAGNOSIS — E785 Hyperlipidemia, unspecified: Secondary | ICD-10-CM | POA: Diagnosis not present

## 2024-01-31 DIAGNOSIS — E113299 Type 2 diabetes mellitus with mild nonproliferative diabetic retinopathy without macular edema, unspecified eye: Secondary | ICD-10-CM | POA: Diagnosis not present

## 2024-01-31 DIAGNOSIS — E7800 Pure hypercholesterolemia, unspecified: Secondary | ICD-10-CM | POA: Diagnosis not present

## 2024-03-03 DIAGNOSIS — Z01419 Encounter for gynecological examination (general) (routine) without abnormal findings: Secondary | ICD-10-CM | POA: Diagnosis not present

## 2024-03-06 DIAGNOSIS — R0683 Snoring: Secondary | ICD-10-CM | POA: Diagnosis not present

## 2024-03-06 DIAGNOSIS — R0681 Apnea, not elsewhere classified: Secondary | ICD-10-CM | POA: Diagnosis not present

## 2024-03-06 DIAGNOSIS — G4719 Other hypersomnia: Secondary | ICD-10-CM | POA: Diagnosis not present

## 2024-03-06 DIAGNOSIS — E113299 Type 2 diabetes mellitus with mild nonproliferative diabetic retinopathy without macular edema, unspecified eye: Secondary | ICD-10-CM | POA: Diagnosis not present

## 2024-03-20 DIAGNOSIS — G4733 Obstructive sleep apnea (adult) (pediatric): Secondary | ICD-10-CM | POA: Diagnosis not present

## 2024-03-25 DIAGNOSIS — G4733 Obstructive sleep apnea (adult) (pediatric): Secondary | ICD-10-CM | POA: Diagnosis not present

## 2024-04-29 IMAGING — NM NM PARATHYROID W/ SPECT
2 series · 12 of 12 positions shown · non-contrast
Comparison: None Available.

CLINICAL DATA: Primary hyperparathyroidism, hypercalcemia

EXAM:
NM PARATHYROID SCINTIGRAPHY AND SPECT IMAGING
TECHNIQUE: Following intravenous administration of radiopharmaceutical, early
and 2-hour delayed planar images were obtained in the anterior
projection. Delayed triplanar SPECT images were also obtained at 2
hours.
RADIOPHARMACEUTICALS:  26.6 mCi Rc-YYm Sestamibi IV

[Series 1: spect - (id)_(id)_cor · 4.1mm · 4.14mm/px · 6 of 128 frames shown]
[frame 11/128]
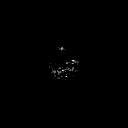
[frame 32/128]
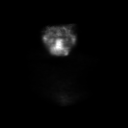
[frame 54/128]
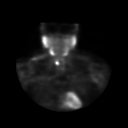
[frame 75/128]
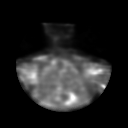
[frame 96/128]
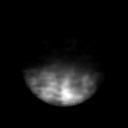
[frame 118/128]
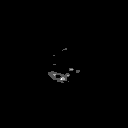

[Series 1: spect - (id)_(id)_tra · 4.1mm · 4.14mm/px · 6 of 128 frames shown]
[frame 11/128]
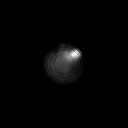
[frame 32/128]
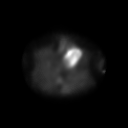
[frame 54/128]
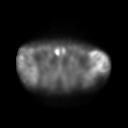
[frame 75/128]
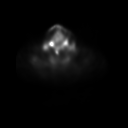
[frame 96/128]
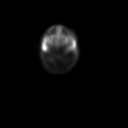
[frame 118/128]
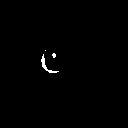

[12 of 12 positions shown; findings below may reference images not displayed]

FINDINGS: Early anterior planar imaging demonstrates physiologic distribution
of radiotracer within the salivary glands and thyroid. Relative
increased activity in the region of the upper pole right lobe
thyroid.

Delayed planar and SPECT imaging demonstrates significant washout of
radiotracer activity from the thyroid bed. There is persistent
nodular radiotracer uptake localizing along the posterior aspect of
the right lobe thyroid, best visualized on SPECT imaging, which
could reflect thyroid nodule or parathyroid adenoma. There is no
other extra thyroid radiotracer uptake within the neck or upper
chest to suggest parathyroid adenoma.
IMPRESSION: 1. Persistent nodular radiotracer uptake localizing along the
posterior margin of the right lobe thyroid. Differential would
include thyroid nodule versus parathyroid adenoma. Correlation with
thyroid ultrasound is recommended.

## 2024-04-30 DIAGNOSIS — H524 Presbyopia: Secondary | ICD-10-CM | POA: Diagnosis not present

## 2024-04-30 DIAGNOSIS — Z794 Long term (current) use of insulin: Secondary | ICD-10-CM | POA: Diagnosis not present

## 2024-04-30 DIAGNOSIS — H52223 Regular astigmatism, bilateral: Secondary | ICD-10-CM | POA: Diagnosis not present

## 2024-04-30 DIAGNOSIS — H5213 Myopia, bilateral: Secondary | ICD-10-CM | POA: Diagnosis not present

## 2024-04-30 DIAGNOSIS — E113293 Type 2 diabetes mellitus with mild nonproliferative diabetic retinopathy without macular edema, bilateral: Secondary | ICD-10-CM | POA: Diagnosis not present

## 2024-05-01 ENCOUNTER — Other Ambulatory Visit: Payer: Self-pay | Admitting: Family Medicine

## 2024-05-01 DIAGNOSIS — Z1231 Encounter for screening mammogram for malignant neoplasm of breast: Secondary | ICD-10-CM

## 2024-05-10 DIAGNOSIS — G4733 Obstructive sleep apnea (adult) (pediatric): Secondary | ICD-10-CM | POA: Diagnosis not present

## 2024-05-29 ENCOUNTER — Encounter

## 2024-05-29 DIAGNOSIS — Z1231 Encounter for screening mammogram for malignant neoplasm of breast: Secondary | ICD-10-CM

## 2024-06-03 ENCOUNTER — Encounter

## 2024-06-10 DIAGNOSIS — G4733 Obstructive sleep apnea (adult) (pediatric): Secondary | ICD-10-CM | POA: Diagnosis not present

## 2024-06-11 ENCOUNTER — Ambulatory Visit
Admission: RE | Admit: 2024-06-11 | Discharge: 2024-06-11 | Disposition: A | Discharge status: Home / Self Care | Source: Ambulatory Visit | Attending: Family Medicine | Admitting: Family Medicine

## 2024-06-11 DIAGNOSIS — Z1231 Encounter for screening mammogram for malignant neoplasm of breast: Secondary | ICD-10-CM | POA: Diagnosis not present

## 2024-07-10 DIAGNOSIS — G4733 Obstructive sleep apnea (adult) (pediatric): Secondary | ICD-10-CM | POA: Diagnosis not present

## 2024-08-14 DIAGNOSIS — E113293 Type 2 diabetes mellitus with mild nonproliferative diabetic retinopathy without macular edema, bilateral: Secondary | ICD-10-CM | POA: Diagnosis not present

## 2024-08-14 DIAGNOSIS — Z794 Long term (current) use of insulin: Secondary | ICD-10-CM | POA: Diagnosis not present

## 2024-08-14 DIAGNOSIS — Z23 Encounter for immunization: Secondary | ICD-10-CM | POA: Diagnosis not present

## 2024-08-14 DIAGNOSIS — E113299 Type 2 diabetes mellitus with mild nonproliferative diabetic retinopathy without macular edema, unspecified eye: Secondary | ICD-10-CM | POA: Diagnosis not present

## 2024-08-14 DIAGNOSIS — F411 Generalized anxiety disorder: Secondary | ICD-10-CM | POA: Diagnosis not present

## 2024-08-14 DIAGNOSIS — E7800 Pure hypercholesterolemia, unspecified: Secondary | ICD-10-CM | POA: Diagnosis not present

## 2024-08-28 DIAGNOSIS — G4733 Obstructive sleep apnea (adult) (pediatric): Secondary | ICD-10-CM | POA: Diagnosis not present

## 2024-09-10 DIAGNOSIS — R011 Cardiac murmur, unspecified: Secondary | ICD-10-CM | POA: Diagnosis not present

## 2024-10-18 DIAGNOSIS — R6889 Other general symptoms and signs: Secondary | ICD-10-CM | POA: Diagnosis not present

## 2024-11-25 DIAGNOSIS — E1122 Type 2 diabetes mellitus with diabetic chronic kidney disease: Secondary | ICD-10-CM | POA: Diagnosis not present
